# Patient Record
Sex: Female | Born: 1935 | Race: White | Hispanic: No | Smoking: Never smoker
Health system: Southern US, Community
[De-identification: ages and names within clinical notes are randomized; demographics above are authoritative.]

## PROBLEM LIST (undated history)

## (undated) DIAGNOSIS — I10 Essential (primary) hypertension: Secondary | ICD-10-CM

## (undated) HISTORY — PX: SHOULDER SURGERY: SHX246

---

## 1998-01-05 ENCOUNTER — Inpatient Hospital Stay (HOSPITAL_COMMUNITY): Admission: EM | Admit: 1998-01-05 | Discharge: 1998-01-09 | Payer: Self-pay | Admitting: Emergency Medicine

## 1999-09-01 ENCOUNTER — Encounter: Payer: Self-pay | Admitting: Internal Medicine

## 1999-09-01 ENCOUNTER — Encounter: Admission: RE | Admit: 1999-09-01 | Discharge: 1999-09-01 | Payer: Self-pay | Admitting: Internal Medicine

## 2001-10-27 ENCOUNTER — Encounter: Admission: RE | Admit: 2001-10-27 | Discharge: 2001-10-27 | Payer: Self-pay | Admitting: Internal Medicine

## 2001-10-27 ENCOUNTER — Encounter: Payer: Self-pay | Admitting: Internal Medicine

## 2008-03-18 ENCOUNTER — Ambulatory Visit: Payer: Self-pay | Admitting: Occupational Medicine

## 2008-03-18 DIAGNOSIS — I10 Essential (primary) hypertension: Secondary | ICD-10-CM | POA: Insufficient documentation

## 2009-06-27 ENCOUNTER — Ambulatory Visit: Payer: Self-pay | Admitting: Family Medicine

## 2009-06-27 DIAGNOSIS — L509 Urticaria, unspecified: Secondary | ICD-10-CM | POA: Insufficient documentation

## 2009-06-27 DIAGNOSIS — F411 Generalized anxiety disorder: Secondary | ICD-10-CM | POA: Insufficient documentation

## 2010-06-18 NOTE — Assessment & Plan Note (Signed)
Summary: Rash - Upper extremities x 2 weeks rm 2   Vital Signs:  Patient Profile:   77 Years Old Female CC:      Rash - x 2 weeks all over body Height:     65 inches Weight:      136 pounds O2 Sat:      100 % O2 treatment:    Room Air Temp:     97.4 degrees F oral Pulse rate:   68 / minute Pulse rhythm:   regular Resp:     16 per minute BP sitting:   188 / 81  (right arm) Cuff size:   regular  Vitals Entered By: Areta Haber CMA (June 27, 2009 7:44 PM)                  Current Allergies: No known allergies History of Present Illness Chief Complaint: Rash - x 2 weeks all over body History of Present Illness: Subjective:  Patient complains of 2 week history of intermittent pruritic rasn on trunk and arms.  Lesions are raised and appear and disappear, improved with Benadryl and desoximetasone 0.05% cream.  She feels well otherwise.  Her daughter states that her mother has been been worrying excessively about family issues.  She states that she had a normal exam and blood work in December by her PCP.  No recent fevers, chills, and sweats.  No resp, GI, or GU symptoms  No past history of asthma, eczema, seasonal allergies.  Current Problems: ANXIETY (ICD-300.00) URTICARIA (ICD-708.9) HYPERTENSION (ICD-401.9)   Current Meds BENICAR HCT 40-12.5 MG TABS (OLMESARTAN MEDOXOMIL-HCTZ) 1 tab by mouth qd HYDROXYZINE HCL 10 MG TABS (HYDROXYZINE HCL) 1 or 2 by mouth q4-6hr as needed itching and/or anxiety PREDNISONE 10 MG TABS (PREDNISONE) 2 PO BID for 2 days, then 1 BID for 2 days, then 1 daily for 2 days.  Take PC  REVIEW OF SYSTEMS       Skin       Comments: Upper extremities x 2 weeks Other Comments: Pt has not seen PCP for this.   Past History:  Past Medical History: Last updated: 03/18/2008 Hypertension  Past Surgical History: Last updated: 03/18/2008 Complete shoulder replacement - 2007  Family History: Last updated: 03/18/2008 Family History of  Cardiovascular disorder  Social History: Last updated: 03/18/2008 Widow/Widower Never Smoked Alcohol use-no Drug use-no Regular exercise-yes  Risk Factors: Exercise: yes (03/18/2008)  Risk Factors: Smoking Status: never (03/18/2008)   Objective:  Appearance:  Patient appears healthy, younger than stated age, and in no acute distress  Eyes:  Pupils are equal, round, and reactive to light and accomdation.  Extraocular movement is intact.  Conjunctivae are not inflamed.  Ears:  Canals normal.  Tympanic membranes normal.   Nose:   No sinus tenderness Mouth:  No lesions Pharynx:  Normal Neck:  Supple.  No adenopathy is present.   Lungs:  Clear to auscultation.  Breath sounds are equal.  Heart:  Regular rate and rhythm without murmurs, rubs, or gallops.  Abdomen:  Nontender without masses or hepatosplenomegaly.  Bowel sounds are present.  No CVA or flank tenderness.  Skin:  scattered urticarial lesions on trunk and arms. Assessment New Problems: ANXIETY (ICD-300.00) URTICARIA (ICD-708.9)  Suspect urticaria secondary to underlying anxiety.  Plan New Medications/Changes: PREDNISONE 10 MG TABS (PREDNISONE) 2 PO BID for 2 days, then 1 BID for 2 days, then 1 daily for 2 days.  Take PC  #14 x 0, 06/27/2009, Donna Christen MD HYDROXYZINE HCL  10 MG TABS (HYDROXYZINE HCL) 1 or 2 by mouth q4-6hr as needed itching and/or anxiety  #30 x 1, 06/27/2009, Donna Christen MD  New Orders: Est. Patient Level IV [04540] Planning Comments:   Tapering course of prednisone.  Vistaril as needed for anxiety and itching. Follow-up with PCP   The patient and/or caregiver has been counseled thoroughly with regard to medications prescribed including dosage, schedule, interactions, rationale for use, and possible side effects and they verbalize understanding.  Diagnoses and expected course of recovery discussed and will return if not improved as expected or if the condition worsens. Patient and/or caregiver  verbalized understanding.  Prescriptions: PREDNISONE 10 MG TABS (PREDNISONE) 2 PO BID for 2 days, then 1 BID for 2 days, then 1 daily for 2 days.  Take PC  #14 x 0   Entered and Authorized by:   Donna Christen MD   Signed by:   Donna Christen MD on 06/27/2009   Method used:   Print then Give to Patient   RxID:   [id number] HYDROXYZINE HCL 10 MG TABS (HYDROXYZINE HCL) 1 or 2 by mouth q4-6hr as needed itching and/or anxiety  #30 x 1   Entered and Authorized by:   Donna Christen MD   Signed by:   Donna Christen MD on 06/27/2009   Method used:   Print then Give to Patient   RxID:   [id number]

## 2013-06-05 ENCOUNTER — Emergency Department
Admission: EM | Admit: 2013-06-05 | Discharge: 2013-06-05 | Disposition: A | Discharge status: Home / Self Care | Payer: Medicare Other | Source: Home / Self Care | Attending: Emergency Medicine | Admitting: Emergency Medicine

## 2013-06-05 ENCOUNTER — Encounter: Payer: Self-pay | Admitting: Emergency Medicine

## 2013-06-05 DIAGNOSIS — L508 Other urticaria: Secondary | ICD-10-CM

## 2013-06-05 HISTORY — DX: Essential (primary) hypertension: I10

## 2013-06-05 MED ORDER — PREDNISONE 20 MG PO TABS: 20.0000 mg | ORAL_TABLET | Freq: Two times a day (BID) | ORAL | Status: DC

## 2013-06-05 MED ORDER — PREDNISONE 20 MG PO TABS: 20.0000 mg | ORAL_TABLET | Freq: Two times a day (BID) | ORAL | Status: AC

## 2013-06-05 MED ORDER — METHYLPREDNISOLONE ACETATE 80 MG/ML IJ SUSP
80.0000 mg | Freq: Once | INTRAMUSCULAR | Status: AC
  Administered 2013-06-05: 80 mg via INTRAMUSCULAR

## 2013-06-05 MED ORDER — CETIRIZINE HCL 10 MG PO TABS: ORAL_TABLET | ORAL | Status: AC

## 2013-06-05 MED ORDER — TRIAMCINOLONE ACETONIDE 0.1 % EX CREA: 1.0000 "application " | TOPICAL_CREAM | Freq: Two times a day (BID) | CUTANEOUS | Status: AC

## 2013-06-05 NOTE — ED Provider Notes (Signed)
CSN: [id number]     Arrival date & time 06/05/13  0845 History   First MD Initiated Contact with Patient 06/05/13 0915     Chief Complaint  Patient presents with  . Rash    Patient is a 80 y.o. female presenting with rash. The history is provided by the patient.  Rash Pain location:  Generalized Pain quality comment:  No pain. Rash is severely pruritic Pain severity:  No pain Onset quality:  Gradual Duration:  4 days Progression:  Worsening Chronicity:  New Context: not diet changes, not recent illness, not recent travel, not sick contacts and not suspicious food intake   Relieved by: Benadryl at nighttime helps a little. Associated symptoms: no chest pain, no chills, no cough, no diarrhea, no fatigue, no fever, no nausea, no sore throat and no vomiting    Sandra Dalton c/o itchy red raised rash x 4 days. She reports the rash itches and has moved over the last couple days. It was on her arms but is now under breasts, around sides and on pelvis. No new lotions, detergents, medications, etc. Used Benadryl cream otc., Last night, which helped somewhat.  No new medications. She does not recall any insect bites. No other complaints. No chest pain or shortness of breath or breathing problem.  No problem swallowing   Past Medical History  Diagnosis Date  . Hypertension    Past Surgical History  Procedure Laterality Date  . Shoulder surgery     History reviewed. No pertinent family history. History  Substance Use Topics  . Smoking status: Never Smoker   . Smokeless tobacco: Never Used  . Alcohol Use: No   OB History   Grav Para Term Preterm Abortions TAB SAB Ect Mult Living                 Review of Systems  Constitutional: Negative for fever, chills and fatigue.  HENT: Negative for sore throat.   Respiratory: Negative for cough.   Cardiovascular: Negative for chest pain.  Gastrointestinal: Negative for nausea, vomiting and diarrhea.  Skin: Positive for rash.  All other systems  reviewed and are negative.    Allergies  Review of patient's allergies indicates no known allergies.  Home Medications   Current Outpatient Rx  Name  Route  Sig  Dispense  Refill  . AMLODIPINE BESYLATE PO   Oral   Take by mouth.         . telmisartan-hydrochlorothiazide (MICARDIS HCT) 80-12.5 MG per tablet   Oral   Take 1 tablet by mouth daily.         . cetirizine (ZYRTEC) 10 MG tablet      Take 1 daily as needed for itch   15 tablet   0   . predniSONE (DELTASONE) 20 MG tablet   Oral   Take 1 tablet (20 mg total) by mouth 2 (two) times daily with a meal. X 5 days   10 tablet   0   . triamcinolone cream (KENALOG) 0.1 %   Topical   Apply 1 application topically 2 (two) times daily.   45 g   0    BP 117/77  Pulse 94  Temp(Src) 98.3 F (36.8 C) (Oral)  Resp 94  Wt 128 lb (58.06 kg)  SpO2 98% Physical Exam  Nursing note and vitals reviewed. Constitutional: She is oriented to person, place, and time. She appears well-developed and well-nourished. No distress.  HENT:  Head: Normocephalic and atraumatic.  Mouth/Throat: Uvula is midline,  oropharynx is clear and moist and mucous membranes are normal. No oropharyngeal exudate, posterior oropharyngeal edema or posterior oropharyngeal erythema.  Eyes: Conjunctivae and EOM are normal. Pupils are equal, round, and reactive to light. No scleral icterus.  Neck: Normal range of motion.  Cardiovascular: Normal rate, regular rhythm and normal heart sounds.   Pulmonary/Chest: Effort normal and breath sounds normal.  Abdominal: She exhibits no distension.  Musculoskeletal: Normal range of motion.  Neurological: She is alert and oriented to person, place, and time.  Skin: Skin is warm. Rash noted. Rash is urticarial. She is not diaphoretic.  Patchy, blanching, urticarial rash on upper extremities, bilateral upper abdomen, trunk, and few scattered lower extremities.  No vesicles or pustules or papules.  Psychiatric: She has  a normal mood and affect.   Rash has some scattered areas on neck, but face not involved. No lip swelling or wheezing. ED Course  Procedures (including critical care time) Labs Review Labs Reviewed - No data to display Imaging Review No results found.  EKG Interpretation    Date/Time:    Ventricular Rate:    PR Interval:    QRS Duration:   QT Interval:    QTC Calculation:   R Axis:     Text Interpretation:              MDM   1. Urticaria, acute    Treatment options discussed, as well as risks, benefits, alternatives. Patient voiced understanding and agreement with the following plans: Depo-Medrol 80 mg IM stat Prednisone 20 mg by mouth twice a day x5 days Zyrtec, 10 mg in a.m. May continue Benadryl, one at bedtime.--Precautions discussed Triamcinolone cream, apply twice a day as needed Other symptomatic care discussed. Written instructions given  An After Visit Summary was printed and given to the patient. Followup with Dr. Kirby Funk, her PCP within one week.--If any severe or worsening symptoms, go to ER stat. Precautions discussed. Red flags discussed. Questions invited and answered. Patient voiced understanding and agreement.     Lajean Manes, MD 06/05/13 [phone]

## 2013-06-05 NOTE — ED Notes (Signed)
Charleene c/o red raised rash x 4 days. She reports the rash itches and has moved over the last couple days. It was on her arms but is not under breasts, around sides and on pelvis. No new lotions, detergents, medications, etc. Used Benadryl cream otc.

## 2016-07-27 ENCOUNTER — Emergency Department (HOSPITAL_COMMUNITY): Payer: Medicare Other

## 2016-07-27 ENCOUNTER — Inpatient Hospital Stay (HOSPITAL_COMMUNITY)
Admission: EM | Admit: 2016-07-27 | Discharge: 2016-07-29 | DRG: 065 | Disposition: A | Discharge status: Hospice | Payer: Medicare Other | Attending: Family Medicine | Admitting: Family Medicine

## 2016-07-27 DIAGNOSIS — R29722 NIHSS score 22: Secondary | ICD-10-CM | POA: Diagnosis present

## 2016-07-27 DIAGNOSIS — Z66 Do not resuscitate: Secondary | ICD-10-CM | POA: Diagnosis present

## 2016-07-27 DIAGNOSIS — Z515 Encounter for palliative care: Secondary | ICD-10-CM | POA: Diagnosis present

## 2016-07-27 DIAGNOSIS — R4701 Aphasia: Secondary | ICD-10-CM | POA: Diagnosis not present

## 2016-07-27 DIAGNOSIS — G8191 Hemiplegia, unspecified affecting right dominant side: Secondary | ICD-10-CM | POA: Diagnosis present

## 2016-07-27 DIAGNOSIS — I63422 Cerebral infarction due to embolism of left anterior cerebral artery: Secondary | ICD-10-CM | POA: Diagnosis present

## 2016-07-27 DIAGNOSIS — I63412 Cerebral infarction due to embolism of left middle cerebral artery: Secondary | ICD-10-CM | POA: Diagnosis not present

## 2016-07-27 DIAGNOSIS — I63512 Cerebral infarction due to unspecified occlusion or stenosis of left middle cerebral artery: Secondary | ICD-10-CM | POA: Diagnosis not present

## 2016-07-27 DIAGNOSIS — W19XXXA Unspecified fall, initial encounter: Secondary | ICD-10-CM | POA: Diagnosis present

## 2016-07-27 DIAGNOSIS — R29810 Facial weakness: Secondary | ICD-10-CM | POA: Diagnosis present

## 2016-07-27 DIAGNOSIS — Z86718 Personal history of other venous thrombosis and embolism: Secondary | ICD-10-CM

## 2016-07-27 DIAGNOSIS — Z823 Family history of stroke: Secondary | ICD-10-CM

## 2016-07-27 DIAGNOSIS — I459 Conduction disorder, unspecified: Secondary | ICD-10-CM | POA: Diagnosis present

## 2016-07-27 DIAGNOSIS — Y92002 Bathroom of unspecified non-institutional (private) residence single-family (private) house as the place of occurrence of the external cause: Secondary | ICD-10-CM

## 2016-07-27 DIAGNOSIS — I10 Essential (primary) hypertension: Secondary | ICD-10-CM | POA: Diagnosis present

## 2016-07-27 LAB — I-STAT CHEM 8, ED
BUN: 32 mg/dL — ABNORMAL HIGH (ref 6–20)
CALCIUM ION: 1.21 mmol/L (ref 1.15–1.40)
CHLORIDE: 103 mmol/L (ref 101–111)
Creatinine, Ser: 1.2 mg/dL — ABNORMAL HIGH (ref 0.44–1.00)
Glucose, Bld: 182 mg/dL — ABNORMAL HIGH (ref 65–99)
HEMATOCRIT: 39 % (ref 36.0–46.0)
Hemoglobin: 13.3 g/dL (ref 12.0–15.0)
Potassium: 4.1 mmol/L (ref 3.5–5.1)
SODIUM: 138 mmol/L (ref 135–145)
TCO2: 24 mmol/L (ref 0–100)

## 2016-07-27 LAB — CBC
HEMATOCRIT: 39.8 % (ref 36.0–46.0)
HEMOGLOBIN: 13 g/dL (ref 12.0–15.0)
MCH: 31.9 pg (ref 26.0–34.0)
MCHC: 32.7 g/dL (ref 30.0–36.0)
MCV: 97.8 fL (ref 78.0–100.0)
Platelets: 169 10*3/uL (ref 150–400)
RBC: 4.07 MIL/uL (ref 3.87–5.11)
RDW: 13.2 % (ref 11.5–15.5)
WBC: 11.9 10*3/uL — ABNORMAL HIGH (ref 4.0–10.5)

## 2016-07-27 LAB — COMPREHENSIVE METABOLIC PANEL
ALBUMIN: 4.4 g/dL (ref 3.5–5.0)
ALT: 14 U/L (ref 14–54)
ANION GAP: 12 (ref 5–15)
AST: 25 U/L (ref 15–41)
Alkaline Phosphatase: 65 U/L (ref 38–126)
BUN: 29 mg/dL — ABNORMAL HIGH (ref 6–20)
CO2: 25 mmol/L (ref 22–32)
Calcium: 10.4 mg/dL — ABNORMAL HIGH (ref 8.9–10.3)
Chloride: 99 mmol/L — ABNORMAL LOW (ref 101–111)
Creatinine, Ser: 1.21 mg/dL — ABNORMAL HIGH (ref 0.44–1.00)
GFR calc Af Amer: 44 mL/min — ABNORMAL LOW (ref >60)
GFR calc non Af Amer: 38 mL/min — ABNORMAL LOW (ref >60)
GLUCOSE: 180 mg/dL — AB (ref 65–99)
POTASSIUM: 4.3 mmol/L (ref 3.5–5.1)
Sodium: 136 mmol/L (ref 135–145)
Total Bilirubin: 0.8 mg/dL (ref 0.3–1.2)
Total Protein: 6.9 g/dL (ref 6.5–8.1)

## 2016-07-27 LAB — ETHANOL: Alcohol, Ethyl (B): <5 mg/dL (ref <5)

## 2016-07-27 LAB — CK TOTAL AND CKMB (NOT AT ARMC)
CK TOTAL: 106 U/L (ref 38–234)
CK, MB: 3.7 ng/mL (ref 0.5–5.0)
RELATIVE INDEX: 3.5 — AB (ref 0.0–2.5)

## 2016-07-27 LAB — I-STAT CG4 LACTIC ACID, ED: Lactic Acid, Venous: 1.94 mmol/L (ref 0.5–1.9)

## 2016-07-27 LAB — PROTIME-INR
INR: 0.96
Prothrombin Time: 12.8 seconds (ref 11.4–15.2)

## 2016-07-27 MED ORDER — IOPAMIDOL (ISOVUE-370) INJECTION 76%
INTRAVENOUS | Status: AC
  Administered 2016-07-27: 100 mL
  Filled 2016-07-27: qty 100

## 2016-07-27 NOTE — ED Notes (Signed)
This RN going to CT with patient. Placed on NRB mask in CT d/t SpO2 dropping to 85% RA. Neurologist at bedside.

## 2016-07-27 NOTE — ED Notes (Signed)
Shanda BumpsJessica, RN accompanying patient to CT 2 at this time.

## 2016-07-27 NOTE — ED Notes (Signed)
Dr. Rhunette CroftNanavati called Code Stroke at 2326.

## 2016-07-27 NOTE — Progress Notes (Signed)
Orthopedic Tech Progress Note Patient Details:  Sandra Dalton 04/12/1925 [id number] Level 2 trauma ortho visit. Patient ID: Sandra Dalton, female   DOB: 03/12/1925, 83 y.o.   MRN: [id number]   Jennye MoccasinHughes, Milany Geck Craig 07/27/2016, 10:55 PM

## 2016-07-27 NOTE — Consult Note (Signed)
Neurology Consultation Reason for Consult: Aphasia Referring Physician: Rhunette Croft, A  CC: Left sided weakness  History is obtained from: Family  HPI: Sandra Dalton is a 83 y.o. female who is completely independent, lives alone, without memory complaints per family. She spoke with a family member around 6:15 pm and subsequently was found shortly after 9pm with blood on her, down in the bathroom.   She was brought in as a trauma due to the blood and being found down, but after arrival, it was noticed that she had right sided plegia and aphasia.   LKW: 6:15pm tpa given?: no, out of window Premorbid modified rankin scale: 0    ROS: A 14 point ROS was performed and is negative except as noted in the HPI.   PMHx: HTN  Meds:  Micardis-HCT  FHx: Unable to assess secondary to patient's altered mental status.    Social History:  has no tobacco, alcohol, and drug history on file. Unable to assess secondary to patient's altered mental status.     Exam: Current vital signs: BP 163/88   Pulse 96   Temp 97.3 F (36.3 C) (Oral)   Resp 21   SpO2 93%  Vital signs in last 24 hours: Temp:  [97.3 F (36.3 C)] 97.3 F (36.3 C) (03/13 2255) Pulse Rate:  [93-101] 96 (03/13 2345) Resp:  [20-26] 21 (03/13 2345) BP: (163-193)/(78-107) 163/88 (03/13 2345) SpO2:  [93 %-100 %] 93 % (03/13 2345)   Physical Exam  Constitutional: Appears elderly Psych: Affect appropriate to situation Eyes: No scleral injection HENT: No OP obstrucion Head: Normocephalic.  Cardiovascular: Normal rate and regular rhythm.  Respiratory: Effort normal and breath sounds normal to anterior ascultation GI: Soft.  No distension. There is no tenderness.  Skin: WDI  Neuro: Mental Status: Patient is drowsy, but makes purposeful movements with her left hand. She does not follow commands or have any verbal output Cranial Nerves: II: She has a right hemianopia. Pupils are equal, round, and reactive to  light. III,IV, VI: She has a left gaze preference V:VII: She has a right facial droop VIII: hearing is impaired, but she does respond to loud noise X: She does not cooperate XII: She does not stick out her tongue Motor: She has a right hemiplegia, with some flexion to noxious stimuli in the right arm and triple flexion in the right leg. She moves left side purposefully with good strength Sensory: She does not respond to noxious stimuli as much on the right as the left Cerebellar: She does not perform   I have reviewed labs in epic and the results pertinent to this consultation are: Chem 8-unremarkable other than mildly elevated creatinine  I have reviewed the images obtained: CT perfusion-151 mL of infarct with much larger area of penumbra. CTA there does seem to be some filling of the posterior division of the MCA through collateralization  Impression: 83 year old female with left ICA-MCA occlusion. She appears to have already infarcted the majority of the left MCA territory. I don't think that any type of intervention at this point would be at all likely to restore any type of independence and given the size of the core infarct, she would not be a candidate for intra-arterial intervention.  I discussed that she would likely have severe right-sided weakness and aphasia in a best case scenario. The family indicated that in this setting, she would not want any type of artificial life-prolonging measures but would only want things that would provide comfort. I think  that in a 83 year old, this is a very reasonable decision, but did indicate that predicting end-of-life is very difficult and that given that she does have space to swell, I was not sure that the stroke itself would be terminal, though it certainly could be.   Recommendations: 1) comfort care only as per family wishes 2) palliative care consultation   Ritta SlotMcNeill Evett Kassa, MD [facility] [phone]  If 7pm-  7am, please page neurology on call as listed in AMION.

## 2016-07-27 NOTE — ED Triage Notes (Addendum)
Per [facility]: Pt to ED from home following unwitnessed fall in the bathroom. Per EMS, family last saw patient normal at 1745 today and then found her on the floor, supine, pool of blood around her head, lac to posterior scalp. Pt not on blood thinners. GCS 7 with EMS, following commands, + gag. L sided visual gaze, pupils equal and reactive. 18g. EJ and 20g LFA. CBG 198, systolic 190 palp. C-collar in place PTA.

## 2016-07-27 NOTE — ED Notes (Signed)
[facility] called to page Code Stroke, Paged by Elmarie Shileyiffany

## 2016-07-27 NOTE — ED Triage Notes (Signed)
According to EMS, patient normally A&O x 4.

## 2016-07-28 ENCOUNTER — Encounter (HOSPITAL_COMMUNITY): Payer: Self-pay | Admitting: Family Medicine

## 2016-07-28 DIAGNOSIS — I63512 Cerebral infarction due to unspecified occlusion or stenosis of left middle cerebral artery: Secondary | ICD-10-CM | POA: Diagnosis not present

## 2016-07-28 DIAGNOSIS — R29722 NIHSS score 22: Secondary | ICD-10-CM | POA: Diagnosis present

## 2016-07-28 DIAGNOSIS — Z823 Family history of stroke: Secondary | ICD-10-CM | POA: Diagnosis not present

## 2016-07-28 DIAGNOSIS — R4701 Aphasia: Secondary | ICD-10-CM | POA: Diagnosis present

## 2016-07-28 DIAGNOSIS — Z66 Do not resuscitate: Secondary | ICD-10-CM | POA: Diagnosis present

## 2016-07-28 DIAGNOSIS — Z86718 Personal history of other venous thrombosis and embolism: Secondary | ICD-10-CM | POA: Diagnosis not present

## 2016-07-28 DIAGNOSIS — Z515 Encounter for palliative care: Secondary | ICD-10-CM | POA: Diagnosis not present

## 2016-07-28 DIAGNOSIS — W19XXXA Unspecified fall, initial encounter: Secondary | ICD-10-CM | POA: Diagnosis present

## 2016-07-28 DIAGNOSIS — I63412 Cerebral infarction due to embolism of left middle cerebral artery: Secondary | ICD-10-CM | POA: Diagnosis present

## 2016-07-28 DIAGNOSIS — I10 Essential (primary) hypertension: Secondary | ICD-10-CM

## 2016-07-28 DIAGNOSIS — I459 Conduction disorder, unspecified: Secondary | ICD-10-CM | POA: Diagnosis present

## 2016-07-28 DIAGNOSIS — I63422 Cerebral infarction due to embolism of left anterior cerebral artery: Secondary | ICD-10-CM | POA: Diagnosis present

## 2016-07-28 DIAGNOSIS — R29810 Facial weakness: Secondary | ICD-10-CM | POA: Diagnosis present

## 2016-07-28 DIAGNOSIS — Y92002 Bathroom of unspecified non-institutional (private) residence single-family (private) house as the place of occurrence of the external cause: Secondary | ICD-10-CM | POA: Diagnosis not present

## 2016-07-28 DIAGNOSIS — G8191 Hemiplegia, unspecified affecting right dominant side: Secondary | ICD-10-CM | POA: Diagnosis present

## 2016-07-28 LAB — TYPE AND SCREEN
ABO/RH(D): A POS
Antibody Screen: NEGATIVE
Unit division: 0
Unit division: 0

## 2016-07-28 LAB — BPAM RBC
BLOOD PRODUCT EXPIRATION DATE: 201803162359
BLOOD PRODUCT EXPIRATION DATE: 201803172359
ISSUE DATE / TIME: 201803132220
ISSUE DATE / TIME: 201803132220
UNIT TYPE AND RH: 9500
Unit Type and Rh: 9500

## 2016-07-28 LAB — PREPARE FRESH FROZEN PLASMA
UNIT DIVISION: 0
Unit division: 0

## 2016-07-28 LAB — CBG MONITORING, ED: Glucose-Capillary: 177 mg/dL — ABNORMAL HIGH (ref 65–99)

## 2016-07-28 LAB — BPAM FFP
Blood Product Expiration Date: 201803182359
Blood Product Expiration Date: 201803182359
ISSUE DATE / TIME: 201803132221
ISSUE DATE / TIME: 201803132221
Unit Type and Rh: 6200
Unit Type and Rh: 6200

## 2016-07-28 LAB — ABO/RH: ABO/RH(D): A POS

## 2016-07-28 LAB — CDS SEROLOGY

## 2016-07-28 LAB — BLOOD PRODUCT ORDER (VERBAL) VERIFICATION

## 2016-07-28 MED ORDER — WHITE PETROLATUM GEL
Status: AC
  Administered 2016-07-28: 17:00:00
  Filled 2016-07-28: qty 1

## 2016-07-28 MED ORDER — LORAZEPAM 2 MG/ML IJ SOLN
0.5000 mg | Freq: Four times a day (QID) | INTRAMUSCULAR | Status: DC | PRN
Start: 2016-07-28 — End: 2016-07-29

## 2016-07-28 MED ORDER — ONDANSETRON HCL 4 MG/2ML IJ SOLN
4.0000 mg | Freq: Four times a day (QID) | INTRAMUSCULAR | Status: DC | PRN
  Administered 2016-07-28: 4 mg via INTRAVENOUS
  Filled 2016-07-28: qty 2

## 2016-07-28 MED ORDER — ACETAMINOPHEN 325 MG PO TABS: 650.0000 mg | ORAL_TABLET | Freq: Four times a day (QID) | ORAL | Status: DC | PRN

## 2016-07-28 MED ORDER — ACETAMINOPHEN 650 MG RE SUPP: 650.0000 mg | Freq: Four times a day (QID) | RECTAL | Status: DC | PRN

## 2016-07-28 MED ORDER — GLYCOPYRROLATE 0.2 MG/ML IJ SOLN
0.4000 mg | Freq: Four times a day (QID) | INTRAMUSCULAR | Status: DC | PRN
  Administered 2016-07-29: 0.4 mg via INTRAVENOUS
  Filled 2016-07-28: qty 2

## 2016-07-28 MED ORDER — ONDANSETRON HCL 4 MG/2ML IJ SOLN
INTRAMUSCULAR | Status: AC
  Administered 2016-07-28: 4 mg
  Filled 2016-07-28: qty 2

## 2016-07-28 MED ORDER — MORPHINE SULFATE (PF) 4 MG/ML IV SOLN
1.0000 mg | INTRAVENOUS | Status: DC | PRN
  Administered 2016-07-29: 1 mg via INTRAVENOUS
  Filled 2016-07-28: qty 1

## 2016-07-28 MED ORDER — MORPHINE SULFATE (PF) 2 MG/ML IV SOLN
2.0000 mg | INTRAVENOUS | Status: DC
  Administered 2016-07-28 – 2016-07-29 (×4): 2 mg via INTRAVENOUS
  Filled 2016-07-28 (×4): qty 1

## 2016-07-28 MED ORDER — MORPHINE SULFATE (PF) 4 MG/ML IV SOLN
1.0000 mg | INTRAVENOUS | Status: DC | PRN
  Administered 2016-07-28 (×4): 1 mg via INTRAVENOUS
  Filled 2016-07-28 (×3): qty 1

## 2016-07-28 MED ORDER — ORAL CARE MOUTH RINSE
15.0000 mL | Freq: Two times a day (BID) | OROMUCOSAL | Status: DC
  Administered 2016-07-28 – 2016-07-29 (×3): 15 mL via OROMUCOSAL

## 2016-07-28 MED ORDER — HALOPERIDOL LACTATE 5 MG/ML IJ SOLN: 1.0000 mg | Freq: Four times a day (QID) | INTRAMUSCULAR | Status: DC | PRN

## 2016-07-28 MED ORDER — MORPHINE SULFATE (PF) 4 MG/ML IV SOLN
INTRAVENOUS | Status: AC
  Administered 2016-07-28: 1 mg via INTRAVENOUS
  Filled 2016-07-28: qty 1

## 2016-07-28 NOTE — Progress Notes (Signed)
Patient seen and admitted earlier this a.m. by my associate. Please refer to H&P for details. Palliative care consulted and on board.  We'll plan on reassessing next a.m.  Gen: Pt in nad CV: no cyanosis Pulm: equal chest rise  Sandra Dalton, Pamala HurryLANDO

## 2016-07-28 NOTE — Progress Notes (Signed)
Responded to page last night when pt came into ED. Spent time accompanying many family members (4 children, +,some of their spouses and children) through difficult emotional responses to come to realization (as they received several succeeding reports from doctors) that pt would not in her present condition want anything but palliative care.The decision was painful, but made easier b/c they all were sure this is what pt would want. She has a living will at home, and had told them all she would never want to be in a nursing home. The son and wife from [place] arrived partway through these discussions, and the son in [place] last night, who will arrive today, was part of this family decision via phone.  Provided emotional/spiritual/grief support, presence and prayer. Toni AmendCourtney, the most involved grandchild, is a Engineer, civil (consulting)nurse and ultimately led the family in consensus that pt needed to retain her dignity as she had always been very independent. Family showed me photos of pt's wonderful 89th bday party last wkend.  The pastor of the son who has power of attorney, Courtney's dad, came, prayed with family too, and stayed a while. He leads a [employer] [place],.The oldest daughter told this brother she was so happy at this time that he and his family had recently become Christians. Toni AmendCourtney confided to me that pt was the reasson she'd made this decision. The oldest daughter's pastor called in and also prayed with family then in consultation rm via speaker phone.   Checked on pt this morning. Several family members were in rm, peaceful and waiting. Chaplain available for f/u.   07/28/16 0700  Clinical Encounter Type  Visited With Family;Health care provider  Visit Type Initial;Follow-up;Psychological support;Spiritual support;Social support;ED;Other (Comment)  Referral From Nurse (palliative)  Spiritual Encounters  Spiritual Needs Prayer;Emotional;Grief support  Stress Factors  Patient Stress Factors Health  changes;Loss of control  Family Stress Factors Family relationships;Health changes;Loss of control   Sandra Dalton, [address]

## 2016-07-28 NOTE — Progress Notes (Signed)
Palliative Medicine RN Note: Consult order noted. Discussed with Dr Hilma Favors Met with 2 sons, daughter, daughter in law. Pt will open eyes to voice. During my visit she makes no movement with right side; falls to left side. Legs crossed at ankles; she fights uncrossing them; unable to palpate pedal pulses. Pale; [place] in place. Dry, clean dressing to right neck. She has dried blood in patches on her face and under her nails on left hand; cleaned face but she fought me cleaning her hand. Tele monitor in place.  Spoke at length with family. They are very focused on what kind of stroke the pt had and why her blood thinners were stopped after 3 months (she has hx DVT); discussed risks r/t falls & blood thinners in light of patient's vertigo and that physicians follow protocols for making those decisions; family verbalized understanding. They state very clearly that she would not want to be kept alive artificially and that they feel she has no quality of life like this. Discussed natural course if patients cannot eat & where they would like patient to be at the end of life. Family is very familiar with home hospice but unfamiliar with hospice facility.   Pt began to hiccup/retch and family became upset; turned pt to her side and RN gave Zofran. Pt did not vomit.  Continued to explore goals with family. Discussed use of tele and that it does not change outcome or treatment; they are not comfortable de-escalating even that intervention. Family is concerned that RNs will not take BP if the tele is d/c. They are absolutely not ready to make decisions about location for care either. Plan for f/u by PMT member this afternoon at 3:30.   Marjie Skiff Erline Siddoway, RN, BSN, [facility] 07/28/2016 10:10 AM Cell [phone] 8:00-4:00 Monday-Friday Office [phone]

## 2016-07-28 NOTE — Care Management Note (Signed)
Case Management Note  Patient Details  Name: Dorrene Germanvelyn S Dufford MRN: [id number] Date of Birth: 05/02/1925  Subjective/Objective:                  Patient was admitted with CVA. Prior to admission, patient lived at home alone. Per physician notation, patient's prognosis is poor. Palliative medicine following. CM will follow for needs pending patient's progress.   Action/Plan:   Expected Discharge Date:                  Expected Discharge Plan:     In-House Referral:     Discharge planning Services     Post Acute Care Choice:    Choice offered to:     DME Arranged:    DME Agency:     HH Arranged:    HH Agency:     Status of Service:     If discussed at [employer] Length of Stay Meetings, dates discussed:    Additional Comments:  Anda KraftRobarge, Luci Bellucci C, RN 07/28/2016, 11:43 AM

## 2016-07-28 NOTE — Progress Notes (Signed)
STROKE TEAM PROGRESS NOTE   HISTORY OF PRESENT ILLNESS (per record) [name] is a 83 y.o. female who is completely independent, lives alone, without memory complaints per family. She spoke with a family member around 6:15 pm and subsequently was found shortly after 9pm with blood on her, down in the bathroom (LKW 07/27/2016 at 1815).  She was brought in as a trauma due to the blood and being found down, but after arrival, it was noticed that she had right sided plegia and aphasia. Premorbid modified rankin scale: 0. Patient was not administered IV t-PA secondary to presenting outside the window. Family opted to make her comfort care in the ED.  She was admitted for comfort care.    SUBJECTIVE (INTERVAL HISTORY) Meet with pt son and granddaughter and other families in room. Reviewed images with them and answered all their questions. They stated that pt would never want to live in a condition without quality of life and not want ventilation or feeding tube. They requested comfort care and I agree.    OBJECTIVE Temp:  [97.3 F (36.3 C)-97.4 F (36.3 C)] 97.4 F (36.3 C) (03/14 0240) Pulse Rate:  [75-101] 86 (03/14 0800) Cardiac Rhythm: Heart block;Normal sinus rhythm (03/14 0700) Resp:  [19-26] 20 (03/14 0240) BP: (129-193)/(68-107) 150/82 (03/14 0800) SpO2:  [93 %-100 %] 100 % (03/14 0800) Weight:  [60.3 kg (132 lb 15 oz)] 60.3 kg (132 lb 15 oz) (03/14 0240)  CBC:  Recent Labs Lab 07/27/16 2251 07/27/16 2255  WBC 11.9*  --   HGB 13.0 13.3  HCT 39.8 39.0  MCV 97.8  --   PLT 169  --     Basic Metabolic Panel:  Recent Labs Lab 07/27/16 2251 07/27/16 2255  NA 136 138  K 4.3 4.1  CL 99* 103  CO2 25  --   GLUCOSE 180* 182*  BUN 29* 32*  CREATININE 1.21* 1.20*  CALCIUM 10.4*  --     IMAGING I have personally reviewed the radiological images below and agree with the radiology interpretations.  Ct Head Wo Contrast 07/27/2016 2326 Head: No acute intracranial  abnormalities. Chronic atrophy and small vessel ischemic changes.   Ct Cervical Spine Wo Contrast 07/27/2016 Diffuse degenerative changes. Nonspecific straightening of usual cervical lordosis. Prominent degenerative changes at C1 to with mild basilar invagination. Bones cyst at the odontoid process of C2. No acute displaced fractures identified in the cervical spine. Atelectasis or consolidation demonstrated in the lung apices.   Ct Angio Head W Or Wo Contrast Result Date: 07/28/2016 Acute LEFT MCA occlusion, collateral vessels filling posterior segments. Developmentally absent RIGHT A1 segment with thready LEFT A1 segment suggesting retrograde thrombosis to the carotid terminus. Moderate intracranial atherosclerosis.   Ct Angio Neck W Or Wo Contrast 07/28/2016 Gradual loss of LEFT internal carotid artery contrast opacification most compatible with slow flow in distal occlusion. No hemodynamically significant stenosis.   Ct Cerebral Perfusion W Contrast 07/28/2016 Acute large LEFT MCA territory completed infarct with penumbra within the posterior margin of the LEFT MCA territory and LEFT anterior cerebral artery territory.    PHYSICAL EXAM  Temp:  [97.2 F (36.2 C)-97.4 F (36.3 C)] 97.2 F (36.2 C) (03/14 0800) Pulse Rate:  [75-101] 86 (03/14 0800) Resp:  [18-26] 18 (03/14 0800) BP: (129-193)/(68-107) 150/82 (03/14 0800) SpO2:  [93 %-100 %] 100 % (03/14 0800) Weight:  [132 lb 15 oz (60.3 kg)] 132 lb 15 oz (60.3 kg) (03/14 0240)  Exam is limited due to comfort  care status.  General - thin built, well developed, poor responsive.  Ophthalmologic - Fundi not visualized.  Cardiovascular - Regular rate and rhythm.  Neuro - poorly responsive, not open eyes on voice. Intermittent moving head. Eyes not open. Did not move extremities during encounter. Other examination was limited due to comfort care status.    ASSESSMENT/PLAN Sandra Dalton is a 83 y.o. female with history of HTN  found down with R hemiparesis, global aphasia and L gaze preference. She did not receive IV t-PA due to delay in arrival.   Stroke:  left MCA/ACA infarct in setting of L MCA occlusion, likely embolic secondary to unknown source  CT head no acute abnormalities  CT spine no acute fx. Diffuse degenerative changes  CTA head L MCA occlusion. Absent R A1 and thready L A1 suggests ICA terminus clot. Moderate intracranial atherosclerosis  CTA neck L ICA slow flow at petrous portion, concerning for thrombus  CT perfusion large L MCA / L ACA territory completed infarct with limited penumbra  Diet NPO time specified  No antithrombotic prior to admission  Palliative care team consulted. Spoke with family at length related to goals of care. Family focused on diagnosis and cause, not ready to decide on disposition. Palliative care team to followup  I discussed with family at bedside at length, all the members of the family have been explained about the diagnosis, current plan and poor prognosis. They are in unanimous decision (as per patient wishes) that they want comfort care given the nature of the condition is serious without meaningful recovery and with no quality of life. I agree that comfort care at this condition is appropriate and reasonable. We also had discussion with comfort care measures. And family is now more prepared for comfort care measures. Our palliative care team will revisit family at 3:30pm and I would expect full comfort care measure after that meeting.   Disposition: expecting comfort care and residence hospice  Hypertension  On diovan/HCT prior to admission  BP flucuation at home recently  Other Stroke Risk Factors  Advanced age  Hx DVT in 11/2015, taken off blood thinners 3 mos after  Had recent chest pain but did not see doctor  Hospital day # 0  Neurology will sign off, but will be available if family or other services have any further questions. Thanks for the  consult.  Marvel PlanJindong [name], MD PhD Stroke Neurology 07/28/2016 2:47 PM   To contact Stroke Continuity provider, please refer to [email or web address]. After hours, contact General Neurology

## 2016-07-28 NOTE — H&P (Signed)
History and Physical  Patient Name: Sandra Dalton     WUJ:[id number]    DOB: February 03, 1925    DOA: 07/27/2016 PCP: Lillia Mountain, MD   Patient coming from: Home via EMS     Chief Complaint: Fall, right sided weakness, aphasia  HPI: Sandra Dalton is a 83 y.o. female with a past medical history significant for HTN who presents with stroke.  All history collected from the patient's family, as the patient is unresponsive.  Patient was in her usual state of health (lives independently, new to family members, independent with all ADLs, no dementia, very active). Family spoke with her by phone on 6:15, at around 10 PM, a family member came by the house to check on her, found her in the bathroom on the floor, collapsed, not moving her right side, unable to speak, with left gaze preference. She was transported to the ER by EMS.  ED course: -Afebrile, heart rate 100, respirations and pulse ox normal, blood pressure 171/100 -Na 136, K 4.3, Cr 1.21, WBC 11.9K, Hgb 13 -CT of the head and C-spine were negative for fracture or hemorrhage or mass -Pelvis radiograph is normal -Chest x-ray was normal -ECG showed sinus tachycardia -Code stroke was called, CT angiogram and perfusion study showed occlusion of the left MCA, large penumbra involving most of the distribution of the left MCA -Neurology discussed the patient's likely prognosis with family, that no therapeutic options were available, that with supportive cares, in the best case she would be aphasic and hemiplegic and dependent; family noted that the patient was previously very active and independent and would have elected for comfort cares only     Review of systems:  Review of Systems  Unable to perform ROS: Patient unresponsive         Past Medical History:  Diagnosis Date  . Hypertension     Past Surgical History:  Procedure Laterality Date  . SHOULDER SURGERY      Social History: Patient lives alone.  Patient walks  unassisted.  She is not a smoker.  She is from [place] originally, lives there now, close to her family.  She is a widow, husband died 27 years ago.    No Known Allergies  Family history: family history includes Stroke in her mother.  Prior to Admission medications   Medication Sig Start Date End Date Taking? Authorizing Provider  telmisartan-hydrochlorothiazide (MICARDIS HCT) 40-12.5 MG tablet Take 1 tablet by mouth daily.   Yes Historical Provider, MD     Physical Exam: BP 145/87   Pulse 93   Temp 97.3 F (36.3 C) (Oral)   Resp 23   SpO2 98%  General appearance: Elderly female, does not open eyes, some spontaneous movements, no spontaneous verbalizations.  Some dried blood in hair and on face. Eyes: Anicteric, conjunctiva pink, lids and lashes normal. PERRL.   Left gaze preference. ENT: No nasal deformity, discharge, epistaxis.   Skin: Warm and dry.  No jaundice.  No suspicious rashes or lesions. Cardiac: RRR, nl S1-S2, no murmurs appreciated.  Capillary refill is brisk.  JVP normal.  No LE edema.  Radial and DP pulses 2+ and symmetric.  No carotid bruits. Respiratory: Normal respiratory rate and rhythm.   GI: Abdomen soft without rigidity.  No grimace with palpation. No ascites, distension, no hepatosplenomegaly.   MSK: No deformities or effusions. Neuro: Pupils are 4 mm and reactive to 3 mm. Left gaze preference.  Does not follow commands.  Right facial droop.  No  withdrawal from pain on right.  Some spontaneous retractions on right.  Left strength grip is present, otherwise does not follow commands.    Psych: Unable to assess.    Labs on Admission:  I have personally reviewed following labs and imaging studies: CBC:  Recent Labs Lab 07/27/16 2251 07/27/16 2255  WBC 11.9*  --   HGB 13.0 13.3  HCT 39.8 39.0  MCV 97.8  --   PLT 169  --    Basic Metabolic Panel:  Recent Labs Lab 07/27/16 2251 07/27/16 2255  NA 136 138  K 4.3 4.1  CL 99* 103  CO2 25  --     GLUCOSE 180* 182*  BUN 29* 32*  CREATININE 1.21* 1.20*  CALCIUM 10.4*  --    GFR: CrCl cannot be calculated (Unknown ideal weight.). Liver Function Tests:  Recent Labs Lab 07/27/16 2251  AST 25  ALT 14  ALKPHOS 65  BILITOT 0.8  PROT 6.9  ALBUMIN 4.4   No results for input(s): LIPASE, AMYLASE in the last 168 hours. No results for input(s): AMMONIA in the last 168 hours. Coagulation Profile:  Recent Labs Lab 07/27/16 2251  INR 0.96   Cardiac Enzymes:  Recent Labs Lab 07/27/16 2251  CKTOTAL 106  CKMB 3.7   BNP (last 3 results) No results for input(s): PROBNP in the last 8760 hours. HbA1C: No results for input(s): HGBA1C in the last 72 hours. CBG:  Recent Labs Lab 07/28/16 0008  GLUCAP 177*   Lipid Profile: No results for input(s): CHOL, HDL, LDLCALC, TRIG, CHOLHDL, LDLDIRECT in the last 72 hours. Thyroid Function Tests: No results for input(s): TSH, T4TOTAL, FREET4, T3FREE, THYROIDAB in the last 72 hours. Anemia Panel: No results for input(s): VITAMINB12, FOLATE, FERRITIN, TIBC, IRON, RETICCTPCT in the last 72 hours. Sepsis Labs: Lactate 1.94 Invalid input(s): PROCALCITONIN, LACTICIDVEN No results found for this or any previous visit (from the past 240 hour(s)).    Radiological Exams on Admission: Personally reviewed CT head and c-spine reports, CT aniogram reports, and radiograph of pelvis report.  CXR personally reviewed and shows no focal opacity or pneumonia: Ct Angio Head W Or Wo Contrast  Result Date: 07/28/2016 CLINICAL DATA:  Aphasia after fall in bathroom. EXAM: CT ANGIOGRAPHY HEAD AND NECK CT PERFUSION BRAIN TECHNIQUE: Multidetector CT imaging of the head and neck was performed using the standard protocol during bolus administration of intravenous contrast. Multiplanar CT image reconstructions and MIPs were obtained to evaluate the vascular anatomy. Carotid stenosis measurements (when applicable) are obtained utilizing NASCET criteria, using the  distal internal carotid diameter as the denominator. Multiphase CT imaging of the brain was performed following IV bolus contrast injection. Subsequent parametric perfusion maps were calculated using RAPID software. CONTRAST:  100 cc Isovue 370 COMPARISON:  CT HEAD July 27, 2016 at 2308 hours FINDINGS: CTA NECK AORTIC ARCH: Normal appearance of the thoracic arch, normal branch pattern. Mild calcific atherosclerosis of arch vessel origins. The origins of the innominate, left Common carotid artery and subclavian artery are widely patent. RIGHT CAROTID SYSTEM: Common carotid artery is widely patent, coursing in a straight line fashion. Normal appearance of the carotid bifurcation without hemodynamically significant stenosis by NASCET criteria. Tortuous internal carotid artery narrowing is patent. LEFT CAROTID SYSTEM: Common carotid artery is widely patent, coursing in a straight line fashion, minimal calcific atherosclerosis. Early carotid bifurcation without hemodynamically significant stenosis by NASCET criteria. Gradual decrease contrast opacification of LEFT cervical internal carotid artery most compatible with distal occlusion. VERTEBRAL ARTERIES:Left vertebral  artery is dominant, mild stenosis of LEFT vertebral artery origin due to atherosclerosis. Bilateral vertebral arteries are patent, and mild extrinsic deformity due to degenerative cervical spine. SKELETON: No acute osseous process though bone windows have not been submitted. Multilevel severe degenerative discs. 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 OTHER NECK: Soft tissues of the neck are non-acute though, not tailored for evaluation. Dependent atelectasis. 2.7 cm heterogeneous RIGHT thyroid nodule. LEFT external jugular intravenous catheter. CTA HEAD ANTERIOR CIRCULATION: Complete loss of contrast opacification LEFT internal carotid artery through the level of the carotid terminus. RIGHT internal carotid artery is patent  though, moderately stenotic at supraclinoid segment with mild poststenotic dilatation. Thready LEFT A1 segment with patent anterior communicating arteries. Developmentally absent RIGHT A1 segment. Patent anterior cerebral artery is. Occluded LEFT middle cerebral artery with collateral vessels filling the posterior segments. RIGHT middle cerebral artery is widely patent. No contrast extravasation or aneurysm. POSTERIOR CIRCULATION: Patent bilateral vertebral artery's, mild luminal regularity old LEFT V4 segment most compatible with atherosclerosis. Patent basilar artery with moderate stenosis proximal basilar artery. Patent main branch vessels. Fetal origin RIGHT posterior cerebral artery. Patent bilateral posterior cerebral arteries with moderate tandem stenoses. No contrast extravasation or aneurysm. VENOUS SINUSES: Major dural venous sinuses are patent though not tailored for evaluation on this angiographic examination. ANATOMIC VARIANTS: Developmentally absent RIGHT A1 segment. Fetal origin RIGHT posterior cerebral artery. DELAYED PHASE: Not performed. MIP images reviewed. CT Brain Perfusion Findings: CBF (<30%) Volume: Perfusion (Tmax>6.0s) volume: Mismatch Volume: Infarction Location:LEFT frontotemporal lobes spanning the middle cerebral arteries. IMPRESSION: CTA NECK: Gradual loss of LEFT internal carotid artery contrast opacification most compatible with slow flow in distal occlusion. No hemodynamically significant stenosis. CTA HEAD: Acute LEFT MCA occlusion, collateral vessels filling posterior segments. Developmentally absent RIGHT A1 segment with thready LEFT A1 segment suggesting retrograde thrombosis to the carotid terminus. Moderate intracranial atherosclerosis. **An incidental finding of potential clinical significance has been found. 2.7 cm RIGHT thyroid nodule for which follow up thyroid sonogram is recommended on a nonemergent basis. This follows ACR consensus guidelines: Managing  Incidental Thyroid Nodules Detected on Imaging: White Paper of the ACR Incidental Thyroid Findings Committee. J Am Coll Radiol 2015; 12:143-150.** CT PERFUSION: Acute large LEFT MCA territory completed infarct with penumbra within the posterior margin of the LEFT MCA territory and LEFT anterior cerebral artery territory. Critical Value/emergent results were called by telephone at the time of interpretation on 07/28/2016 at 12:28 am to Dr. Ritta Slot , who verbally acknowledged these results. Electronically Signed   By: Awilda Metro M.D.   On: 07/28/2016 01:51   Ct Head Wo Contrast  Result Date: 07/27/2016 CLINICAL DATA:  Unwitnessed fall in the bathroom. Laceration to posterior scout with bleeding. EXAM: CT HEAD WITHOUT CONTRAST CT CERVICAL SPINE WITHOUT CONTRAST TECHNIQUE: Multidetector CT imaging of the head and cervical spine was performed following the standard protocol without intravenous contrast. Multiplanar CT image reconstructions of the cervical spine were also generated. COMPARISON:  None. FINDINGS: CT HEAD FINDINGS Brain: Diffuse cerebral atrophy. Ventricular dilatation consistent with central atrophy. Low-attenuation changes in the deep white matter consistent with small vessel ischemia. No mass effect or midline shift. No abnormal extra-axial fluid collections. Bas-white matter junctions are distinct. Basal cisterns are not effaced. No acute intracranial hemorrhage. Vascular: Vascular calcifications are present in the carotid siphons and vertebrobasilar arteries. Skull: The calvarium appears intact. Sinuses/Orbits: Mucosal thickening in the paranasal sinuses. No acute air-fluid levels. Mastoid air cells are not opacified. Other:  None. CT CERVICAL SPINE FINDINGS Alignment: Straightening of the usual cervical lordosis. This may be due to patient positioning or degenerative change but muscle spasm or ligamentous injury can also have this appearance and are not excluded. There is evidence  of basilar invagination of the odontoid process with degenerative changes at C1 to level. Skull base and vertebrae: There is a benign-appearing cyst in the odontoid process of C2. Degenerative changes at C1 to cause loss of the space between the anterior arch of C1 and the odontoid process. No vertebral compression deformities. No acute fractures are demonstrated. Soft tissues and spinal canal: No prevertebral fluid or swelling. No visible canal hematoma. Disc levels: Degenerative changes diffusely throughout the cervical spine with narrowed interspaces and endplate hypertrophic changes. Prominent disc osteophyte complexes cause some encroachment upon the central canal at C4-5, C5-6, and C6-7 levels. Degenerative changes throughout the facet joints. Upper chest: Evaluation is limited due to respiratory motion but there is evidence of atelectasis or consolidation in both lungs posteriorly. Other: Diffuse enlargement of the thyroid gland on the right. This is probably due to goiter. Vascular calcifications in the cervical carotid arteries. IMPRESSION: Head: No acute intracranial abnormalities. Chronic atrophy and small vessel ischemic changes. Cervical spine: Diffuse degenerative changes. Nonspecific straightening of usual cervical lordosis. Prominent degenerative changes at C1 to with mild basilar invagination. Bones cyst at the odontoid process of C2. No acute displaced fractures identified in the cervical spine. Atelectasis or consolidation demonstrated in the lung apices. Electronically Signed   By: Burman Nieves M.D.   On: 07/27/2016 23:26   Ct Angio Neck W Or Wo Contrast  Result Date: 07/28/2016 CLINICAL DATA:  Aphasia after fall in bathroom. EXAM: CT ANGIOGRAPHY HEAD AND NECK CT PERFUSION BRAIN TECHNIQUE: Multidetector CT imaging of the head and neck was performed using the standard protocol during bolus administration of intravenous contrast. Multiplanar CT image reconstructions and MIPs were obtained  to evaluate the vascular anatomy. Carotid stenosis measurements (when applicable) are obtained utilizing NASCET criteria, using the distal internal carotid diameter as the denominator. Multiphase CT imaging of the brain was performed following IV bolus contrast injection. Subsequent parametric perfusion maps were calculated using RAPID software. CONTRAST:  100 cc Isovue 370 COMPARISON:  CT HEAD July 27, 2016 at 2308 hours FINDINGS: CTA NECK AORTIC ARCH: Normal appearance of the thoracic arch, normal branch pattern. Mild calcific atherosclerosis of arch vessel origins. The origins of the innominate, left Common carotid artery and subclavian artery are widely patent. RIGHT CAROTID SYSTEM: Common carotid artery is widely patent, coursing in a straight line fashion. Normal appearance of the carotid bifurcation without hemodynamically significant stenosis by NASCET criteria. Tortuous internal carotid artery narrowing is patent. LEFT CAROTID SYSTEM: Common carotid artery is widely patent, coursing in a straight line fashion, minimal calcific atherosclerosis. Early carotid bifurcation without hemodynamically significant stenosis by NASCET criteria. Gradual decrease contrast opacification of LEFT cervical internal carotid artery most compatible with distal occlusion. VERTEBRAL ARTERIES:Left vertebral artery is dominant, mild stenosis of LEFT vertebral artery origin due to atherosclerosis. Bilateral vertebral arteries are patent, and mild extrinsic deformity due to degenerative cervical spine. SKELETON: No acute osseous process though bone windows have not been submitted. Multilevel severe degenerative discs. 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 OTHER NECK: Soft tissues of the neck are non-acute though, not tailored for evaluation. Dependent atelectasis. 2.7 cm heterogeneous RIGHT thyroid nodule. LEFT external jugular intravenous catheter. CTA HEAD ANTERIOR CIRCULATION: Complete loss of contrast  opacification LEFT internal carotid  artery through the level of the carotid terminus. RIGHT internal carotid artery is patent though, moderately stenotic at supraclinoid segment with mild poststenotic dilatation. Thready LEFT A1 segment with patent anterior communicating arteries. Developmentally absent RIGHT A1 segment. Patent anterior cerebral artery is. Occluded LEFT middle cerebral artery with collateral vessels filling the posterior segments. RIGHT middle cerebral artery is widely patent. No contrast extravasation or aneurysm. POSTERIOR CIRCULATION: Patent bilateral vertebral artery's, mild luminal regularity old LEFT V4 segment most compatible with atherosclerosis. Patent basilar artery with moderate stenosis proximal basilar artery. Patent main branch vessels. Fetal origin RIGHT posterior cerebral artery. Patent bilateral posterior cerebral arteries with moderate tandem stenoses. No contrast extravasation or aneurysm. VENOUS SINUSES: Major dural venous sinuses are patent though not tailored for evaluation on this angiographic examination. ANATOMIC VARIANTS: Developmentally absent RIGHT A1 segment. Fetal origin RIGHT posterior cerebral artery. DELAYED PHASE: Not performed. MIP images reviewed. CT Brain Perfusion Findings: CBF (<30%) Volume: Perfusion (Tmax>6.0s) volume: Mismatch Volume: Infarction Location:LEFT frontotemporal lobes spanning the middle cerebral arteries. IMPRESSION: CTA NECK: Gradual loss of LEFT internal carotid artery contrast opacification most compatible with slow flow in distal occlusion. No hemodynamically significant stenosis. CTA HEAD: Acute LEFT MCA occlusion, collateral vessels filling posterior segments. Developmentally absent RIGHT A1 segment with thready LEFT A1 segment suggesting retrograde thrombosis to the carotid terminus. Moderate intracranial atherosclerosis. **An incidental finding of potential clinical significance has been found. 2.7 cm RIGHT thyroid  nodule for which follow up thyroid sonogram is recommended on a nonemergent basis. This follows ACR consensus guidelines: Managing Incidental Thyroid Nodules Detected on Imaging: White Paper of the ACR Incidental Thyroid Findings Committee. J Am Coll Radiol 2015; 12:143-150.** CT PERFUSION: Acute large LEFT MCA territory completed infarct with penumbra within the posterior margin of the LEFT MCA territory and LEFT anterior cerebral artery territory. Critical Value/emergent results were called by telephone at the time of interpretation on 07/28/2016 at 12:28 am to Dr. Ritta Slot , who verbally acknowledged these results. Electronically Signed   By: Awilda Metro M.D.   On: 07/28/2016 01:51   Ct Cervical Spine Wo Contrast  Result Date: 07/27/2016 CLINICAL DATA:  Unwitnessed fall in the bathroom. Laceration to posterior scout with bleeding. EXAM: CT HEAD WITHOUT CONTRAST CT CERVICAL SPINE WITHOUT CONTRAST TECHNIQUE: Multidetector CT imaging of the head and cervical spine was performed following the standard protocol without intravenous contrast. Multiplanar CT image reconstructions of the cervical spine were also generated. COMPARISON:  None. FINDINGS: CT HEAD FINDINGS Brain: Diffuse cerebral atrophy. Ventricular dilatation consistent with central atrophy. Low-attenuation changes in the deep white matter consistent with small vessel ischemia. No mass effect or midline shift. No abnormal extra-axial fluid collections. Naas-white matter junctions are distinct. Basal cisterns are not effaced. No acute intracranial hemorrhage. Vascular: Vascular calcifications are present in the carotid siphons and vertebrobasilar arteries. Skull: The calvarium appears intact. Sinuses/Orbits: Mucosal thickening in the paranasal sinuses. No acute air-fluid levels. Mastoid air cells are not opacified. Other: None. CT CERVICAL SPINE FINDINGS Alignment: Straightening of the usual cervical lordosis. This may be due to patient  positioning or degenerative change but muscle spasm or ligamentous injury can also have this appearance and are not excluded. There is evidence of basilar invagination of the odontoid process with degenerative changes at C1 to level. Skull base and vertebrae: There is a benign-appearing cyst in the odontoid process of C2. Degenerative changes at C1 to cause loss of the space between the anterior arch of C1 and the odontoid process. No  vertebral compression deformities. No acute fractures are demonstrated. Soft tissues and spinal canal: No prevertebral fluid or swelling. No visible canal hematoma. Disc levels: Degenerative changes diffusely throughout the cervical spine with narrowed interspaces and endplate hypertrophic changes. Prominent disc osteophyte complexes cause some encroachment upon the central canal at C4-5, C5-6, and C6-7 levels. Degenerative changes throughout the facet joints. Upper chest: Evaluation is limited due to respiratory motion but there is evidence of atelectasis or consolidation in both lungs posteriorly. Other: Diffuse enlargement of the thyroid gland on the right. This is probably due to goiter. Vascular calcifications in the cervical carotid arteries. IMPRESSION: Head: No acute intracranial abnormalities. Chronic atrophy and small vessel ischemic changes. Cervical spine: Diffuse degenerative changes. Nonspecific straightening of usual cervical lordosis. Prominent degenerative changes at C1 to with mild basilar invagination. Bones cyst at the odontoid process of C2. No acute displaced fractures identified in the cervical spine. Atelectasis or consolidation demonstrated in the lung apices. Electronically Signed   By: Burman Nieves M.D.   On: 07/27/2016 23:26   Dg Pelvis Portable  Result Date: 07/27/2016 CLINICAL DATA:  Trauma.  Unwitnessed fall tonight. EXAM: PORTABLE PELVIS 1-2 VIEWS COMPARISON:  None. FINDINGS: The patient is rotated. Diffuse bone demineralization. There is no  evidence of pelvic fracture or diastasis. No pelvic bone lesions are seen. Degenerative changes in the hips. IMPRESSION: No acute bony abnormalities.  Degenerative changes in the hips. Electronically Signed   By: Burman Nieves M.D.   On: 07/27/2016 23:15   Ct Cerebral Perfusion W Contrast  Result Date: 07/28/2016 CLINICAL DATA:  Aphasia after fall in bathroom. EXAM: CT ANGIOGRAPHY HEAD AND NECK CT PERFUSION BRAIN TECHNIQUE: Multidetector CT imaging of the head and neck was performed using the standard protocol during bolus administration of intravenous contrast. Multiplanar CT image reconstructions and MIPs were obtained to evaluate the vascular anatomy. Carotid stenosis measurements (when applicable) are obtained utilizing NASCET criteria, using the distal internal carotid diameter as the denominator. Multiphase CT imaging of the brain was performed following IV bolus contrast injection. Subsequent parametric perfusion maps were calculated using RAPID software. CONTRAST:  100 cc Isovue 370 COMPARISON:  CT HEAD July 27, 2016 at 2308 hours FINDINGS: CTA NECK AORTIC ARCH: Normal appearance of the thoracic arch, normal branch pattern. Mild calcific atherosclerosis of arch vessel origins. The origins of the innominate, left Common carotid artery and subclavian artery are widely patent. RIGHT CAROTID SYSTEM: Common carotid artery is widely patent, coursing in a straight line fashion. Normal appearance of the carotid bifurcation without hemodynamically significant stenosis by NASCET criteria. Tortuous internal carotid artery narrowing is patent. LEFT CAROTID SYSTEM: Common carotid artery is widely patent, coursing in a straight line fashion, minimal calcific atherosclerosis. Early carotid bifurcation without hemodynamically significant stenosis by NASCET criteria. Gradual decrease contrast opacification of LEFT cervical internal carotid artery most compatible with distal occlusion. VERTEBRAL ARTERIES:Left  vertebral artery is dominant, mild stenosis of LEFT vertebral artery origin due to atherosclerosis. Bilateral vertebral arteries are patent, and mild extrinsic deformity due to degenerative cervical spine. SKELETON: No acute osseous process though bone windows have not been submitted. Multilevel severe degenerative discs. 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 OTHER NECK: Soft tissues of the neck are non-acute though, not tailored for evaluation. Dependent atelectasis. 2.7 cm heterogeneous RIGHT thyroid nodule. LEFT external jugular intravenous catheter. CTA HEAD ANTERIOR CIRCULATION: Complete loss of contrast opacification LEFT internal carotid artery through the level of the carotid terminus. RIGHT internal carotid artery is patent though,  moderately stenotic at supraclinoid segment with mild poststenotic dilatation. Thready LEFT A1 segment with patent anterior communicating arteries. Developmentally absent RIGHT A1 segment. Patent anterior cerebral artery is. Occluded LEFT middle cerebral artery with collateral vessels filling the posterior segments. RIGHT middle cerebral artery is widely patent. No contrast extravasation or aneurysm. POSTERIOR CIRCULATION: Patent bilateral vertebral artery's, mild luminal regularity old LEFT V4 segment most compatible with atherosclerosis. Patent basilar artery with moderate stenosis proximal basilar artery. Patent main branch vessels. Fetal origin RIGHT posterior cerebral artery. Patent bilateral posterior cerebral arteries with moderate tandem stenoses. No contrast extravasation or aneurysm. VENOUS SINUSES: Major dural venous sinuses are patent though not tailored for evaluation on this angiographic examination. ANATOMIC VARIANTS: Developmentally absent RIGHT A1 segment. Fetal origin RIGHT posterior cerebral artery. DELAYED PHASE: Not performed. MIP images reviewed. CT Brain Perfusion Findings: CBF (<30%) Volume: Perfusion (Tmax>6.0s) volume:  Mismatch Volume: Infarction Location:LEFT frontotemporal lobes spanning the middle cerebral arteries. IMPRESSION: CTA NECK: Gradual loss of LEFT internal carotid artery contrast opacification most compatible with slow flow in distal occlusion. No hemodynamically significant stenosis. CTA HEAD: Acute LEFT MCA occlusion, collateral vessels filling posterior segments. Developmentally absent RIGHT A1 segment with thready LEFT A1 segment suggesting retrograde thrombosis to the carotid terminus. Moderate intracranial atherosclerosis. **An incidental finding of potential clinical significance has been found. 2.7 cm RIGHT thyroid nodule for which follow up thyroid sonogram is recommended on a nonemergent basis. This follows ACR consensus guidelines: Managing Incidental Thyroid Nodules Detected on Imaging: White Paper of the ACR Incidental Thyroid Findings Committee. J Am Coll Radiol 2015; 12:143-150.** CT PERFUSION: Acute large LEFT MCA territory completed infarct with penumbra within the posterior margin of the LEFT MCA territory and LEFT anterior cerebral artery territory. Critical Value/emergent results were called by telephone at the time of interpretation on 07/28/2016 at 12:28 am to Dr. Ritta Slot , who verbally acknowledged these results. Electronically Signed   By: Awilda Metro M.D.   On: 07/28/2016 01:51   Dg Chest Port 1 View  Result Date: 07/27/2016 CLINICAL DATA:  Unwitnessed fall. Tachycardia and shortness of breath. EXAM: PORTABLE CHEST 1 VIEW COMPARISON:  None. FINDINGS: Shallow inspiration. Heart size and pulmonary vascularity are normal for technique. Tortuous and dilated aorta. Mediastinum is prominent. This could just be due to shallow inspiration and portable technique but mediastinal injury is not excluded. No focal airspace disease or consolidation in the lungs. No blunting of costophrenic angles. No pneumothorax. Visualize ribs appear intact. Postoperative changes in both  shoulders. IMPRESSION: Shallow inspiration. No evidence of active pulmonary disease. Prominence of the mediastinum is likely due to technique and dilated aorta but mediastinal injury not excluded. Electronically Signed   By: Burman Nieves M.D.   On: 07/27/2016 23:17     EKG: Independently reviewed. Rate 101, QTc 493.  Artifact in baseline.    Assessment/Plan  1. Acute Stroke:  This is new.  Family confirm that patient would have wanted to limit invasive or uncomfortable testing and focus on comfort measures only.  However, they appear very apprehensive about her overnight status, and so I will order those forms of monitoring that I feel will best balance her dignity/comfort with the ability to track her trajectory overnight (in particular telemetry and neuro checks overnight), but will defer further workup, aspirin, lab work, or imaging. -Admit to telemetry -Neuro checks  -Permissive hypertension for now -Will pend PT/OT/SLP consultation in case patient is more alert in next 24 hours -Consult to Neurology, appreciate recommendations -Comfort  measures:   avoid BP checks, reposition q2hrs, no IV fluids for now, morphine for pain, ondansetron for nausea, Consult to palliative care    2. HTN:  -Permissive hypertension for now, hold Telmisartan-HCT          DVT prophylaxis: None, comfort measures  Code Status: DO NOT RESUSCITATE  Family Communication: Son, daughters, granddaughters, and family friends at bedside.  CODE STATUS confirmed.  Overnight plan for neuro checks  Disposition Plan: Anticipate close monitoring as above and consult to ancillary services. Consult to Palliative care tomorrow.  Further disposition pending clinical course. Consults called: Neurology, Dr. Amada Jupiter has seen patient. Admission status: Telemetry, INPATIENT status  Core measures: Deferred given comfort measures only.      Medical decision making: Patient seen at 1:15 AM on 07/28/2016.  The  patient was discussed with Dr. Amada Jupiter and Dr. Moody Bruins. What exists of the patient's chart was reviewed in depth and summarized above.  Clinical condition: at present hemodynamically stable, overall prognosis poor, possibly in hospital death.       Alberteen Sam [facility] Pager [phone]

## 2016-07-28 NOTE — Progress Notes (Signed)
OT Cancellation Note  Patient Details Name: Sandra Dalton MRN: [id number] DOB: 01/20/1925   Cancelled Treatment:    Reason Eval/Treat Not Completed: Medical issues which prohibited therapy. Pt/family proceding with comfort care measures. Per MD, therapy appropriate to sign off at this time. Please re-consult if needs change. OT will sign off.  Doristine Sectionharity A Tollie Canada, MS OTR/L  Pager: [phone]   Laval Cafaro A Porcha Deblanc 07/28/2016, 4:00 PM

## 2016-07-28 NOTE — Progress Notes (Signed)
SLP Cancellation Note  Patient Details Name: Sandra Dalton MRN: [id number] DOB: 05/01/1925   Cancelled treatment:       Reason Eval/Treat Not Completed: Patient's level of consciousness; family opting for comfort care per Dr. Warren DanesXu's note.  Our services will sign off.    Blenda MountsCouture, Trevion Hoben Laurice 07/28/2016, 3:09 PM

## 2016-07-28 NOTE — Progress Notes (Addendum)
Patient arrived to unit via ED staff with family at bedside.  Patient not able to follow commands; family oriented to unit/room. Tele applied to patient and verified.  Admission done per family.  Continue to monitor patient.

## 2016-07-28 NOTE — Progress Notes (Signed)
Mrs. Sandra Dalton is a 83 yo female coming via [facility] from home. She is normally independent and lives alone. Approx 2100 pt found down lying in a pool of blood in her bathroom. LKW 1815 which is the last time she spoke with a family member. Originally came in as a level 1 trauma but was cancelled after arriving to ED where pt was noted to have right sided plegia and aphasia. NIHSS 22.

## 2016-07-28 NOTE — ED Provider Notes (Signed)
[facility]-EMERGENCY DEPT Provider Note   CSN: [id number] Arrival date & time: 07/27/16  2240   An emergency department physician performed an initial assessment on this suspected stroke patient at 2324.  History   Chief Complaint Chief Complaint  Patient presents with  . Trauma    HPI MADELIENE Dalton is a 83 y.o. female.  HPI Sandra Dalton is a 83 y/o F who comes in with cc of fall.  LEVEL 5 CAVEAT FOR ALTERED MENTAL STATUS.  Pt comes in as level 1 trauma activation. Per EMS, pt was found down on the floor by a family member. Pt is normally active, functional and alert and lives independently. She was last normal at 5:30 pm. Pt was found on the floor with blood  around her. Family on way - pt full code for now.  No past medical history on file.  Patient Active Problem List   Diagnosis Date Noted  . Acute ischemic right MCA stroke (HCC) 07/28/2016    No past surgical history on file.  OB History    No data available       Home Medications    Prior to Admission medications   Not on File    Family History No family history on file.  Social History Social History  Substance Use Topics  . Smoking status: Not on file  . Smokeless tobacco: Not on file  . Alcohol use Not on file     Allergies   Patient has no allergy information on record.   Review of Systems Review of Systems  Unable to perform ROS: Patient unresponsive     Physical Exam Updated Vital Signs BP 145/87   Pulse 93   Temp 97.3 F (36.3 C) (Oral)   Resp 23   SpO2 98%   Physical Exam  Constitutional: She appears well-developed.  HENT:  Hematoma on the posterior side of the head  Eyes: EOM are normal.  L sided gaze  Neck:  C-collar placed  Cardiovascular: Normal rate.   Pulmonary/Chest: Effort normal.  + gag reflex  Abdominal: Soft.  Neurological:  GCS: 9 E: 3 V: 1 M: 5  Pt has R sided droop. Pt has R sided upper extremity weakness. She is moving all 4 extremities. Pt has a L  sided gaze.   Skin: Skin is warm and dry.  Nursing note and vitals reviewed.    ED Treatments / Results  Labs (all labs ordered are listed, but only abnormal results are displayed) Labs Reviewed  COMPREHENSIVE METABOLIC PANEL - Abnormal; Notable for the following:       Result Value   Chloride 99 (*)    Glucose, Bld 180 (*)    BUN 29 (*)    Creatinine, Ser 1.21 (*)    Calcium 10.4 (*)    GFR calc non Af Amer 38 (*)    GFR calc Af Amer 44 (*)    All other components within normal limits  CBC - Abnormal; Notable for the following:    WBC 11.9 (*)    All other components within normal limits  CK TOTAL AND CKMB (NOT AT [facility]) - Abnormal; Notable for the following:    Relative Index 3.5 (*)    All other components within normal limits  I-STAT CHEM 8, ED - Abnormal; Notable for the following:    BUN 32 (*)    Creatinine, Ser 1.20 (*)    Glucose, Bld 182 (*)    All other components within normal limits  I-STAT CG4 LACTIC ACID, ED - Abnormal; Notable for the following:    Lactic Acid, Venous 1.94 (*)    All other components within normal limits  CBG MONITORING, ED - Abnormal; Notable for the following:    Glucose-Capillary 177 (*)    All other components within normal limits  CDS SEROLOGY  ETHANOL  PROTIME-INR  URINALYSIS, ROUTINE W REFLEX MICROSCOPIC  TYPE AND SCREEN  PREPARE FRESH FROZEN PLASMA  ABO/RH    EKG  EKG Interpretation None      ED ECG REPORT   Date: 07/28/2016  Rate: TACHYCARDIA  Rhythm: normal sinus rhythm  QRS Axis: normal  Intervals: normal  ST/T Wave abnormalities: nonspecific ST/T changes  Conduction Disutrbances:none  Narrative Interpretation:   Old EKG Reviewed: none available  I have personally reviewed the EKG tracing and agree with the computerized printout as noted.   Radiology Ct Head Wo Contrast  Result Date: 07/27/2016 CLINICAL DATA:  Unwitnessed fall in the bathroom. Laceration to posterior scout with bleeding. EXAM: CT HEAD  WITHOUT CONTRAST CT CERVICAL SPINE WITHOUT CONTRAST TECHNIQUE: Multidetector CT imaging of the head and cervical spine was performed following the standard protocol without intravenous contrast. Multiplanar CT image reconstructions of the cervical spine were also generated. COMPARISON:  None. FINDINGS: CT HEAD FINDINGS Brain: Diffuse cerebral atrophy. Ventricular dilatation consistent with central atrophy. Low-attenuation changes in the deep white matter consistent with small vessel ischemia. No mass effect or midline shift. No abnormal extra-axial fluid collections. Neuzil-white matter junctions are distinct. Basal cisterns are not effaced. No acute intracranial hemorrhage. Vascular: Vascular calcifications are present in the carotid siphons and vertebrobasilar arteries. Skull: The calvarium appears intact. Sinuses/Orbits: Mucosal thickening in the paranasal sinuses. No acute air-fluid levels. Mastoid air cells are not opacified. Other: None. CT CERVICAL SPINE FINDINGS Alignment: Straightening of the usual cervical lordosis. This may be due to patient positioning or degenerative change but muscle spasm or ligamentous injury can also have this appearance and are not excluded. There is evidence of basilar invagination of the odontoid process with degenerative changes at C1 to level. Skull base and vertebrae: There is a benign-appearing cyst in the odontoid process of C2. Degenerative changes at C1 to cause loss of the space between the anterior arch of C1 and the odontoid process. No vertebral compression deformities. No acute fractures are demonstrated. Soft tissues and spinal canal: No prevertebral fluid or swelling. No visible canal hematoma. Disc levels: Degenerative changes diffusely throughout the cervical spine with narrowed interspaces and endplate hypertrophic changes. Prominent disc osteophyte complexes cause some encroachment upon the central canal at C4-5, C5-6, and C6-7 levels. Degenerative changes  throughout the facet joints. Upper chest: Evaluation is limited due to respiratory motion but there is evidence of atelectasis or consolidation in both lungs posteriorly. Other: Diffuse enlargement of the thyroid gland on the right. This is probably due to goiter. Vascular calcifications in the cervical carotid arteries. IMPRESSION: Head: No acute intracranial abnormalities. Chronic atrophy and small vessel ischemic changes. Cervical spine: Diffuse degenerative changes. Nonspecific straightening of usual cervical lordosis. Prominent degenerative changes at C1 to with mild basilar invagination. Bones cyst at the odontoid process of C2. No acute displaced fractures identified in the cervical spine. Atelectasis or consolidation demonstrated in the lung apices. Electronically Signed   By: Burman NievesWilliam  Stevens M.D.   On: 07/27/2016 23:26   Ct Cervical Spine Wo Contrast  Result Date: 07/27/2016 CLINICAL DATA:  Unwitnessed fall in the bathroom. Laceration to posterior scout with bleeding. EXAM: CT HEAD  WITHOUT CONTRAST CT CERVICAL SPINE WITHOUT CONTRAST TECHNIQUE: Multidetector CT imaging of the head and cervical spine was performed following the standard protocol without intravenous contrast. Multiplanar CT image reconstructions of the cervical spine were also generated. COMPARISON:  None. FINDINGS: CT HEAD FINDINGS Brain: Diffuse cerebral atrophy. Ventricular dilatation consistent with central atrophy. Low-attenuation changes in the deep white matter consistent with small vessel ischemia. No mass effect or midline shift. No abnormal extra-axial fluid collections. Davtyan-white matter junctions are distinct. Basal cisterns are not effaced. No acute intracranial hemorrhage. Vascular: Vascular calcifications are present in the carotid siphons and vertebrobasilar arteries. Skull: The calvarium appears intact. Sinuses/Orbits: Mucosal thickening in the paranasal sinuses. No acute air-fluid levels. Mastoid air cells are not  opacified. Other: None. CT CERVICAL SPINE FINDINGS Alignment: Straightening of the usual cervical lordosis. This may be due to patient positioning or degenerative change but muscle spasm or ligamentous injury can also have this appearance and are not excluded. There is evidence of basilar invagination of the odontoid process with degenerative changes at C1 to level. Skull base and vertebrae: There is a benign-appearing cyst in the odontoid process of C2. Degenerative changes at C1 to cause loss of the space between the anterior arch of C1 and the odontoid process. No vertebral compression deformities. No acute fractures are demonstrated. Soft tissues and spinal canal: No prevertebral fluid or swelling. No visible canal hematoma. Disc levels: Degenerative changes diffusely throughout the cervical spine with narrowed interspaces and endplate hypertrophic changes. Prominent disc osteophyte complexes cause some encroachment upon the central canal at C4-5, C5-6, and C6-7 levels. Degenerative changes throughout the facet joints. Upper chest: Evaluation is limited due to respiratory motion but there is evidence of atelectasis or consolidation in both lungs posteriorly. Other: Diffuse enlargement of the thyroid gland on the right. This is probably due to goiter. Vascular calcifications in the cervical carotid arteries. IMPRESSION: Head: No acute intracranial abnormalities. Chronic atrophy and small vessel ischemic changes. Cervical spine: Diffuse degenerative changes. Nonspecific straightening of usual cervical lordosis. Prominent degenerative changes at C1 to with mild basilar invagination. Bones cyst at the odontoid process of C2. No acute displaced fractures identified in the cervical spine. Atelectasis or consolidation demonstrated in the lung apices. Electronically Signed   By: Burman Nieves M.D.   On: 07/27/2016 23:26   Dg Pelvis Portable  Result Date: 07/27/2016 CLINICAL DATA:  Trauma.  Unwitnessed fall  tonight. EXAM: PORTABLE PELVIS 1-2 VIEWS COMPARISON:  None. FINDINGS: The patient is rotated. Diffuse bone demineralization. There is no evidence of pelvic fracture or diastasis. No pelvic bone lesions are seen. Degenerative changes in the hips. IMPRESSION: No acute bony abnormalities.  Degenerative changes in the hips. Electronically Signed   By: Burman Nieves M.D.   On: 07/27/2016 23:15   Dg Chest Port 1 View  Result Date: 07/27/2016 CLINICAL DATA:  Unwitnessed fall. Tachycardia and shortness of breath. EXAM: PORTABLE CHEST 1 VIEW COMPARISON:  None. FINDINGS: Shallow inspiration. Heart size and pulmonary vascularity are normal for technique. Tortuous and dilated aorta. Mediastinum is prominent. This could just be due to shallow inspiration and portable technique but mediastinal injury is not excluded. No focal airspace disease or consolidation in the lungs. No blunting of costophrenic angles. No pneumothorax. Visualize ribs appear intact. Postoperative changes in both shoulders. IMPRESSION: Shallow inspiration. No evidence of active pulmonary disease. Prominence of the mediastinum is likely due to technique and dilated aorta but mediastinal injury not excluded. Electronically Signed   By: Marisa Cyphers.D.  On: 07/27/2016 23:17    Procedures Procedures (including critical care time)  CRITICAL CARE Performed by: Derwood Kaplan   Total critical care time: 85 minutes  Critical care time was exclusive of separately billable procedures and treating other patients.  Critical care was necessary to treat or prevent imminent or life-threatening deterioration.  Critical care was time spent personally by me on the following activities: development of treatment plan with patient and/or surrogate as well as nursing, discussions with consultants, evaluation of patient's response to treatment, examination of patient, obtaining history from patient or surrogate, ordering and performing treatments and  interventions, ordering and review of laboratory studies, ordering and review of radiographic studies, pulse oximetry and re-evaluation of patient's condition.   Medications Ordered in ED Medications  iopamidol (ISOVUE-370) 76 % injection (100 mLs  Contrast Given 07/27/16 2342)     Initial Impression / Assessment and Plan / ED Course  I have reviewed the triage vital signs and the nursing notes.  Pertinent labs & imaging results that were available during my care of the patient were reviewed by me and considered in my medical decision making (see chart for details).     Pt comes in with cc of level 1 trauma and a fall.  TRAUMA ACTIVATION was downgraded to LEVEL 2. Trauma at bedside and agrees. Based on initial exam, we think that pt either has an acute brain bleed or stroke. Pt was protecting airway initially. We decided not to intubate and get a CT brain. Ct showed no acute bleed. Code stroke called and Neuro to bedside immediately. CT-A ordered. Pt started having some difficulty controlling secretion at the time CT-A ordered - but neuro didn't want to delay the study / intervention. Also - we wanted to have further goals of care discussion with family at that time.  CT-A done and showed severe infarction of the L MCA, not amenable to intervention. Neurology team and we discussed the poor prognosis, and Neurology later discussed the findings and expected poor outcome with family. Family decided to keep patient comfort care only. Head at 30 degrees. We will admit.  Final Clinical Impressions(s) / ED Diagnoses   Final diagnoses:  Acute ischemic left MCA stroke [facility])    New Prescriptions New Prescriptions   No medications on file     Derwood Kaplan, MD 07/28/16 0120

## 2016-07-28 NOTE — Progress Notes (Addendum)
PT Cancellation Note  Patient Details Name: Sandra Dalton MRN: [id number] DOB: 10/29/1924   Cancelled Treatment:    Reason Eval/Treat Not Completed: Medical issues which prohibited therapy.   PT eval received, chart reviewed. Per admitting MD's note, appears that PT consult will be pending pt improvement. Noted that family to have a meeting with Palliative Medicine this afternoon. Please indicate in Palliative note if PT will be appropriate to follow up tomorrow. Thank you.   Marylynn PearsonLaura D Kadiatou Oplinger 07/28/2016, 12:22 PM   Addendum: Discussed pt case with MD on 07/28/16 at 1430 who states that therapy is appropriate to sign off at this time. If needs change, please reconsult. Will alert OT.   Conni SlipperLaura Dwight Burdo, PT, DPT Acute Rehabilitation Services Pager: [phone]

## 2016-07-28 NOTE — Progress Notes (Signed)
Palliative Medicine RN Note: Afternoon follow up with family. Pt is less responsive to me but smiles when grandchildren talk to her. All four children present, as well as multiple siblings and grandchildren (including one granddaughter who is an Charity fundraiserN). They have had long family conversations and agree that they would like pt to go to inpatient hospice. Offered choice of local facilities; family reports that they have spoken to friends and request [facility]. I called [facility]; they will meet tomorrow around noon.   Pt has been having pain per family; discussed option of scheduled medication; agree that would be beneficial. Dr Phillips OdorGolding gave order for scheduled morphine and prn robinul. Plan f/u by PMT RN in the am.   Margret ChanceMelanie G. Brodie Scovell, RN, BSN, [facility] 07/28/2016 4:23 PM Cell [phone] 8:00-4:00 Monday-Friday Office [phone]

## 2016-07-28 NOTE — ED Notes (Signed)
Patient returned to room and placed back on monitor.  

## 2016-07-29 DIAGNOSIS — Z515 Encounter for palliative care: Secondary | ICD-10-CM

## 2016-07-29 MED ORDER — SODIUM CHLORIDE 0.9 % IV SOLN: 1.0000 mg/h | INTRAVENOUS | Status: AC

## 2016-07-29 MED ORDER — MORPHINE BOLUS VIA INFUSION
2.0000 mg | INTRAVENOUS | Status: DC | PRN
Start: 2016-07-29 — End: 2016-07-29
  Filled 2016-07-29: qty 2

## 2016-07-29 MED ORDER — LORAZEPAM 2 MG/ML IJ SOLN
0.5000 mg | Freq: Two times a day (BID) | INTRAMUSCULAR | Status: DC
  Filled 2016-07-29: qty 1

## 2016-07-29 MED ORDER — MORPHINE SULFATE (PF) 2 MG/ML IV SOLN
2.0000 mg | INTRAVENOUS | Status: DC
  Administered 2016-07-29: 2 mg via INTRAVENOUS
  Filled 2016-07-29: qty 1

## 2016-07-29 MED ORDER — LORAZEPAM 2 MG/ML IJ SOLN: 0.5000 mg | Freq: Four times a day (QID) | INTRAMUSCULAR | 0 refills | Status: AC | PRN

## 2016-07-29 MED ORDER — SODIUM CHLORIDE 0.9 % IV SOLN
1.0000 mg/h | INTRAVENOUS | Status: DC
  Filled 2016-07-29: qty 10

## 2016-07-29 MED ORDER — MORPHINE SULFATE (PF) 2 MG/ML IV SOLN
2.0000 mg | INTRAVENOUS | Status: DC | PRN
  Administered 2016-07-29: 2 mg via INTRAVENOUS
  Filled 2016-07-29: qty 1

## 2016-07-29 MED ORDER — MORPHINE SULFATE (PF) 2 MG/ML IV SOLN
1.0000 mg | INTRAVENOUS | Status: DC | PRN
  Administered 2016-07-29: 1 mg via INTRAVENOUS
  Filled 2016-07-29: qty 1

## 2016-07-29 NOTE — Consult Note (Signed)
Consultation Note Date: 07/29/2016   Patient Name: Sandra Dalton  DOB: 04/27/1925  MRN: [id number]  Age / Sex: 83 y.o., female  PCP: Kirby Funk, MD Referring Physician: Penny Pia, MD  Reason for Consultation: Establishing goals of care, Inpatient hospice referral, Non pain symptom management, Pain control, Psychosocial/spiritual support and Terminal Care  HPI/Patient Profile: 83 yo woman previouisly healthy and independent who was found down for unknown period of time with large L MCA stroke. Given her advanced age and severity of presentation family elected for comfort care. They are actively grieving and coping with the conditions of impending death. This was unexpected and not the circumstances they would have chosen clearly.  The patient is described as fiercely independent and was very active with her friends and in the community.   Clinical Assessment and Goals of Care: Patient is actively dying. Unresponsive, slightly agitated. Family struggling with details of her care and carry with them a difficult experience at this hospital with their father dying and do have some inherent mistrust in the healthcare delivery system and are somewhat on edge and guarded about her care- they want to fight for their moms comfort and dignity-I provided much reassurance. They are anxious with good reason and trying to make the best decisions possible for her.  They expressed reservations about morphine, the foley and seizure medication-but after thoughtful discussion we will place a foley, start a continuous infusion and give seizure prophylaxis.   HOPT meeting with them now.  Hospice facility would be ideal for her EOL care.  HCPOA-son Eddie    SUMMARY OF RECOMMENDATIONS     Full comfort  Morphine infusion with bolus  Scheduled BID ativan for seizure prophylaxis  Place foley for comfort  Hospice Facility  for EOL care    Prognosis:   Hours - Days  Discharge Planning: Hospice facility      Primary Diagnoses: Present on Admission: . Acute ischemic left MCA stroke (HCC) . Essential hypertension   I have reviewed the medical record, interviewed the patient and family, and examined the patient. The following aspects are pertinent.  Past Medical History:  Diagnosis Date  . Hypertension    Social History   Social History  . Marital status: Widowed    Spouse name: N/A  . Number of children: N/A  . Years of education: N/A   Social History Main Topics  . Smoking status: Never Smoker  . Smokeless tobacco: Never Used  . Alcohol use None  . Drug use: Unknown  . Sexual activity: Not Asked   Other Topics Concern  . None   Social History Narrative  . None   Family History  Problem Relation Age of Onset  . Stroke Mother    Scheduled Meds: . LORazepam  0.5 mg Intravenous q12n4p  . mouth rinse  15 mL Mouth Rinse BID   Continuous Infusions: . morphine     PRN Meds:.glycopyrrolate, haloperidol lactate, LORazepam, morphine, ondansetron (ZOFRAN) IV Medications Prior to Admission:  Prior to Admission medications  Medication Sig Start Date End Date Taking? Authorizing Provider  valsartan-hydrochlorothiazide (DIOVAN-HCT) 160-12.5 MG tablet Take 1 tablet by mouth daily.   Yes Historical Provider, MD   No Known Allergies Review of Systems  Physical Exam  Vital Signs: BP 135/73 (BP Location: Left Arm)   Pulse (!) 58   Temp 97.8 F (36.6 C) (Axillary)   Resp 14   Ht 5\' 5"  (1.651 m)   Wt 60.3 kg (132 lb 15 oz)   SpO2 100%   BMI 22.12 kg/m  Pain Assessment: PAINAD   Pain Score: 0-No pain   SpO2: SpO2: 100 % O2 Device:SpO2: 100 % O2 Flow Rate: .O2 Flow Rate (L/min): 4 L/min  IO: Intake/output summary:  Intake/Output Summary (Last 24 hours) at 07/29/16 1248 Last data filed at 07/28/16 1300  Gross per 24 hour  Intake                0 ml  Output                0  ml  Net                0 ml    LBM: Last BM Date:  (pta; family does not know as she lived alone) Baseline Weight: Weight: 60.3 kg (132 lb 15 oz) Most recent weight: Weight: 60.3 kg (132 lb 15 oz)     Palliative Assessment/Data:   Flowsheet Rows     Most Recent Value  Intake Tab  Unit at Time of Referral  Other (Comment)  Palliative Care Primary Diagnosis  Neurology  Date Notified  07/28/16  Palliative Care Type  New Palliative care  Reason for referral  End of Life Care Assistance  Date of Admission  07/27/16  Date first seen by Palliative Care  07/28/16  # of days Palliative referral response time  0 Day(s)  # of days IP prior to Palliative referral  1  Clinical Assessment  Palliative Performance Scale Score  10%  Pain Max last 24 hours  Not able to report  Pain Min Last 24 hours  Not able to report  Dyspnea Max Last 24 Hours  Not able to report  Dyspnea Min Last 24 hours  Not able to report  Nausea Max Last 24 Hours  Not able to report  Nausea Min Last 24 Hours  Not able to report  Anxiety Max Last 24 Hours  Not able to report  Anxiety Min Last 24 Hours  Not able to report  Psychosocial & Spiritual Assessment  Palliative Care Outcomes  Patient/Family meeting held?  Yes  Who was at the meeting?  2 sons, daughter, son in law  Palliative Care Outcomes  Counseled regarding hospice      Time Total: 70 minutes Greater than 50%  of this time was spent counseling and coordinating care related to the above assessment and plan.  Signed by: Anderson MaltaGOLDING,ELIZABETH, DO   Please contact Palliative Medicine Team phone at [phone] for questions and concerns.  For individual provider: See Loretha StaplerAmion

## 2016-07-29 NOTE — Progress Notes (Signed)
Palliative Medicine RN Note: Checked on pt prior to transfer. Family refused morphine drip. Obtained order for prn morphine to hold her over until [facility] arrives (they are running late, and it may be 1-2 hours). Family denies further questions or needs.   Margret ChanceMelanie G. Alisa Stjames, RN, BSN, [facility] 07/29/2016 3:32 PM Cell [phone] 8:00-4:00 Monday-Friday Office [phone]

## 2016-07-29 NOTE — Care Management Note (Signed)
Case Management Note  Patient Details  Name: Sandra Dalton MRN: [id number] Date of Birth: 12/24/1924  Subjective/Objective:                    Action/Plan: [facility]. No further needs per CM.   Expected Discharge Date:  07/29/16               Expected Discharge Plan:  Hospice Medical Facility  In-House Referral:  Clinical Social Work  Discharge planning Services     Post Acute Care Choice:    Choice offered to:     DME Arranged:    DME Agency:     HH Arranged:    HH Agency:     Status of Service:  Completed, signed off  If discussed at [employer] Meetings, dates discussed:    Additional Comments:  Kermit BaloKelli F Marella Vanderpol, RN 07/29/2016, 2:49 PM

## 2016-07-29 NOTE — Progress Notes (Signed)
Clinical Social Worker was contacted by the admission coordinator of [facility] (Nelson Chimesiana Stevens). Lafonda MossesDiana stated she has reached out to the family and the family has agreed to send patient to hospice. Lafonda MossesDiana stated everything for patient was taken care of and requested [facility] be called for patient transport.   Clinical information faxed to facility and family agreeable with plan.  CS[facility]    Clinical Social Worker will sign off for now as social work intervention is no longer needed. Please consult us again if new need arises.  Marrianne MoodAshley Chananya Canizalez, MSW, LCSWA [phone]   Marrianne MoodAshley Bertie Simien, MSW,  Theresia MajorsLCSWA [phone]

## 2016-07-29 NOTE — Discharge Summary (Addendum)
Physician Discharge Summary  Sandra Dalton UXL:[id number] DOB: 02/15/1925 DOA: 07/27/2016  PCP: Lillia MountainGRIFFIN,JOHN JOSEPH, MD  Admit date: 07/27/2016 Discharge date: 07/29/2016  Time spent: 36 minutes  Recommendations for Outpatient Follow-up:  1. Continue comfort measures   Discharge Diagnoses:  Principal Problem:   Acute ischemic left MCA stroke [facility](HCC) Active Problems:   Essential hypertension   Aphasia   Palliative care by specialist   Discharge Condition: stable  Diet recommendation: npo  Filed Weights   07/28/16 0240  Weight: 60.3 kg (132 lb 15 oz)    History of present illness:  83 y.o. female with a past medical history significant for HTN who presents with stroke.  All history collected from the patient's family, as the patient is unresponsive.  Patient was in her usual state of health (lives independently, new to family members, independent with all ADLs, no dementia, very active). Family spoke with her by phone on 6:15, at around 10 PM, a family member came by the house to check on her, found her in the bathroom on the floor, collapsed, not moving her right side, unable to speak, with left gaze preference. She was transported to the ER by EMS  Hospital Course:  Large L MCA stroke - left patient unable to eat and bedbound. Given prognosis comfort measures employed and patient will be transitioned to Hospice for EOL care.   Procedures:  None  Consultations:  Palliative care  Discharge Exam: Vitals:   07/29/16 0630 07/29/16 1259  BP:  (!) 116/51  Pulse:  (!) 53  Resp: 14 16  Temp:  99.4 F (37.4 C)    General: Pt in nad Cardiovascular: no cyanosis Respiratory: no increased wob, no wheezes  Discharge Instructions   Discharge Instructions    Call MD for:  persistant nausea and vomiting    Complete by:  As directed    Call MD for:  severe uncontrolled pain    Complete by:  As directed    Diet - low sodium heart healthy    Complete by:  As directed     Discharge instructions    Complete by:  As directed    Continue to ensure comfort care measures for EOL   Increase activity slowly    Complete by:  As directed      Current Discharge Medication List    START taking these medications   Details  LORazepam (ATIVAN) 2 MG/ML injection Inject 0.25 mLs (0.5 mg total) into the vein every 6 (six) hours as needed for anxiety. Qty: 1 mL, Refills: 0    morphine 250 mg in sodium chloride 0.9 % 240 mL Inject 1 mg/hr into the vein continuous.      STOP taking these medications     valsartan-hydrochlorothiazide (DIOVAN-HCT) 160-12.5 MG tablet        No Known Allergies    The results of significant diagnostics from this hospitalization (including imaging, microbiology, ancillary and laboratory) are listed below for reference.    Significant Diagnostic Studies: Ct Angio Head W Or Wo Contrast  Result Date: 07/28/2016 CLINICAL DATA:  Aphasia after fall in bathroom. EXAM: CT ANGIOGRAPHY HEAD AND NECK CT PERFUSION BRAIN TECHNIQUE: Multidetector CT imaging of the head and neck was performed using the standard protocol during bolus administration of intravenous contrast. Multiplanar CT image reconstructions and MIPs were obtained to evaluate the vascular anatomy. Carotid stenosis measurements (when applicable) are obtained utilizing NASCET criteria, using the distal internal carotid diameter as the denominator. Multiphase CT imaging of the  brain was performed following IV bolus contrast injection. Subsequent parametric perfusion maps were calculated using RAPID software. CONTRAST:  100 cc Isovue 370 COMPARISON:  CT HEAD July 27, 2016 at 2308 hours FINDINGS: CTA NECK AORTIC ARCH: Normal appearance of the thoracic arch, normal branch pattern. Mild calcific atherosclerosis of arch vessel origins. The origins of the innominate, left Common carotid artery and subclavian artery are widely patent. RIGHT CAROTID SYSTEM: Common carotid artery is widely patent,  coursing in a straight line fashion. Normal appearance of the carotid bifurcation without hemodynamically significant stenosis by NASCET criteria. Tortuous internal carotid artery narrowing is patent. LEFT CAROTID SYSTEM: Common carotid artery is widely patent, coursing in a straight line fashion, minimal calcific atherosclerosis. Early carotid bifurcation without hemodynamically significant stenosis by NASCET criteria. Gradual decrease contrast opacification of LEFT cervical internal carotid artery most compatible with distal occlusion. VERTEBRAL ARTERIES:Left vertebral artery is dominant, mild stenosis of LEFT vertebral artery origin due to atherosclerosis. Bilateral vertebral arteries are patent, and mild extrinsic deformity due to degenerative cervical spine. SKELETON: No acute osseous process though bone windows have not been submitted. Multilevel severe degenerative discs. 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 OTHER NECK: Soft tissues of the neck are non-acute though, not tailored for evaluation. Dependent atelectasis. 2.7 cm heterogeneous RIGHT thyroid nodule. LEFT external jugular intravenous catheter. CTA HEAD ANTERIOR CIRCULATION: Complete loss of contrast opacification LEFT internal carotid artery through the level of the carotid terminus. RIGHT internal carotid artery is patent though, moderately stenotic at supraclinoid segment with mild poststenotic dilatation. Thready LEFT A1 segment with patent anterior communicating arteries. Developmentally absent RIGHT A1 segment. Patent anterior cerebral artery is. Occluded LEFT middle cerebral artery with collateral vessels filling the posterior segments. RIGHT middle cerebral artery is widely patent. No contrast extravasation or aneurysm. POSTERIOR CIRCULATION: Patent bilateral vertebral artery's, mild luminal regularity old LEFT V4 segment most compatible with atherosclerosis. Patent basilar artery with moderate stenosis proximal  basilar artery. Patent main branch vessels. Fetal origin RIGHT posterior cerebral artery. Patent bilateral posterior cerebral arteries with moderate tandem stenoses. No contrast extravasation or aneurysm. VENOUS SINUSES: Major dural venous sinuses are patent though not tailored for evaluation on this angiographic examination. ANATOMIC VARIANTS: Developmentally absent RIGHT A1 segment. Fetal origin RIGHT posterior cerebral artery. DELAYED PHASE: Not performed. MIP images reviewed. CT Brain Perfusion Findings: CBF (<30%) Volume: Perfusion (Tmax>6.0s) volume: Mismatch Volume: Infarction Location:LEFT frontotemporal lobes spanning the middle cerebral arteries. IMPRESSION: CTA NECK: Gradual loss of LEFT internal carotid artery contrast opacification most compatible with slow flow in distal occlusion. No hemodynamically significant stenosis. CTA HEAD: Acute LEFT MCA occlusion, collateral vessels filling posterior segments. Developmentally absent RIGHT A1 segment with thready LEFT A1 segment suggesting retrograde thrombosis to the carotid terminus. Moderate intracranial atherosclerosis. **An incidental finding of potential clinical significance has been found. 2.7 cm RIGHT thyroid nodule for which follow up thyroid sonogram is recommended on a nonemergent basis. This follows ACR consensus guidelines: Managing Incidental Thyroid Nodules Detected on Imaging: White Paper of the ACR Incidental Thyroid Findings Committee. J Am Coll Radiol 2015; 12:143-150.** CT PERFUSION: Acute large LEFT MCA territory completed infarct with penumbra within the posterior margin of the LEFT MCA territory and LEFT anterior cerebral artery territory. Critical Value/emergent results were called by telephone at the time of interpretation on 07/28/2016 at 12:28 am to Dr. Ritta Slot , who verbally acknowledged these results. Electronically Signed   By: Awilda Metro M.D.   On: 07/28/2016 01:51   Ct  Head Wo  Contrast  Result Date: 07/27/2016 CLINICAL DATA:  Unwitnessed fall in the bathroom. Laceration to posterior scout with bleeding. EXAM: CT HEAD WITHOUT CONTRAST CT CERVICAL SPINE WITHOUT CONTRAST TECHNIQUE: Multidetector CT imaging of the head and cervical spine was performed following the standard protocol without intravenous contrast. Multiplanar CT image reconstructions of the cervical spine were also generated. COMPARISON:  None. FINDINGS: CT HEAD FINDINGS Brain: Diffuse cerebral atrophy. Ventricular dilatation consistent with central atrophy. Low-attenuation changes in the deep white matter consistent with small vessel ischemia. No mass effect or midline shift. No abnormal extra-axial fluid collections. [place]-white matter junctions are distinct. Basal cisterns are not effaced. No acute intracranial hemorrhage. Vascular: Vascular calcifications are present in the carotid siphons and vertebrobasilar arteries. Skull: The calvarium appears intact. Sinuses/Orbits: Mucosal thickening in the paranasal sinuses. No acute air-fluid levels. Mastoid air cells are not opacified. Other: None. CT CERVICAL SPINE FINDINGS Alignment: Straightening of the usual cervical lordosis. This may be due to patient positioning or degenerative change but muscle spasm or ligamentous injury can also have this appearance and are not excluded. There is evidence of basilar invagination of the odontoid process with degenerative changes at C1 to level. Skull base and vertebrae: There is a benign-appearing cyst in the odontoid process of C2. Degenerative changes at C1 to cause loss of the space between the anterior arch of C1 and the odontoid process. No vertebral compression deformities. No acute fractures are demonstrated. Soft tissues and spinal canal: No prevertebral fluid or swelling. No visible canal hematoma. Disc levels: Degenerative changes diffusely throughout the cervical spine with narrowed interspaces and endplate hypertrophic  changes. Prominent disc osteophyte complexes cause some encroachment upon the central canal at C4-5, C5-6, and C6-7 levels. Degenerative changes throughout the facet joints. Upper chest: Evaluation is limited due to respiratory motion but there is evidence of atelectasis or consolidation in both lungs posteriorly. Other: Diffuse enlargement of the thyroid gland on the right. This is probably due to goiter. Vascular calcifications in the cervical carotid arteries. IMPRESSION: Head: No acute intracranial abnormalities. Chronic atrophy and small vessel ischemic changes. Cervical spine: Diffuse degenerative changes. Nonspecific straightening of usual cervical lordosis. Prominent degenerative changes at C1 to with mild basilar invagination. Bones cyst at the odontoid process of C2. No acute displaced fractures identified in the cervical spine. Atelectasis or consolidation demonstrated in the lung apices. Electronically Signed   By: Burman Nieves M.D.   On: 07/27/2016 23:26   Ct Angio Neck W Or Wo Contrast  Result Date: 07/28/2016 CLINICAL DATA:  Aphasia after fall in bathroom. EXAM: CT ANGIOGRAPHY HEAD AND NECK CT PERFUSION BRAIN TECHNIQUE: Multidetector CT imaging of the head and neck was performed using the standard protocol during bolus administration of intravenous contrast. Multiplanar CT image reconstructions and MIPs were obtained to evaluate the vascular anatomy. Carotid stenosis measurements (when applicable) are obtained utilizing NASCET criteria, using the distal internal carotid diameter as the denominator. Multiphase CT imaging of the brain was performed following IV bolus contrast injection. Subsequent parametric perfusion maps were calculated using RAPID software. CONTRAST:  100 cc Isovue 370 COMPARISON:  CT HEAD July 27, 2016 at 2308 hours FINDINGS: CTA NECK AORTIC ARCH: Normal appearance of the thoracic arch, normal branch pattern. Mild calcific atherosclerosis of arch vessel origins. The origins  of the innominate, left Common carotid artery and subclavian artery are widely patent. RIGHT CAROTID SYSTEM: Common carotid artery is widely patent, coursing in a straight line fashion. Normal appearance of the carotid bifurcation without  hemodynamically significant stenosis by NASCET criteria. Tortuous internal carotid artery narrowing is patent. LEFT CAROTID SYSTEM: Common carotid artery is widely patent, coursing in a straight line fashion, minimal calcific atherosclerosis. Early carotid bifurcation without hemodynamically significant stenosis by NASCET criteria. Gradual decrease contrast opacification of LEFT cervical internal carotid artery most compatible with distal occlusion. VERTEBRAL ARTERIES:Left vertebral artery is dominant, mild stenosis of LEFT vertebral artery origin due to atherosclerosis. Bilateral vertebral arteries are patent, and mild extrinsic deformity due to degenerative cervical spine. SKELETON: No acute osseous process though bone windows have not been submitted. Multilevel severe degenerative discs. 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 OTHER NECK: Soft tissues of the neck are non-acute though, not tailored for evaluation. Dependent atelectasis. 2.7 cm heterogeneous RIGHT thyroid nodule. LEFT external jugular intravenous catheter. CTA HEAD ANTERIOR CIRCULATION: Complete loss of contrast opacification LEFT internal carotid artery through the level of the carotid terminus. RIGHT internal carotid artery is patent though, moderately stenotic at supraclinoid segment with mild poststenotic dilatation. Thready LEFT A1 segment with patent anterior communicating arteries. Developmentally absent RIGHT A1 segment. Patent anterior cerebral artery is. Occluded LEFT middle cerebral artery with collateral vessels filling the posterior segments. RIGHT middle cerebral artery is widely patent. No contrast extravasation or aneurysm. POSTERIOR CIRCULATION: Patent bilateral vertebral  artery's, mild luminal regularity old LEFT V4 segment most compatible with atherosclerosis. Patent basilar artery with moderate stenosis proximal basilar artery. Patent main branch vessels. Fetal origin RIGHT posterior cerebral artery. Patent bilateral posterior cerebral arteries with moderate tandem stenoses. No contrast extravasation or aneurysm. VENOUS SINUSES: Major dural venous sinuses are patent though not tailored for evaluation on this angiographic examination. ANATOMIC VARIANTS: Developmentally absent RIGHT A1 segment. Fetal origin RIGHT posterior cerebral artery. DELAYED PHASE: Not performed. MIP images reviewed. CT Brain Perfusion Findings: CBF (<30%) Volume: Perfusion (Tmax>6.0s) volume: Mismatch Volume: Infarction Location:LEFT frontotemporal lobes spanning the middle cerebral arteries. IMPRESSION: CTA NECK: Gradual loss of LEFT internal carotid artery contrast opacification most compatible with slow flow in distal occlusion. No hemodynamically significant stenosis. CTA HEAD: Acute LEFT MCA occlusion, collateral vessels filling posterior segments. Developmentally absent RIGHT A1 segment with thready LEFT A1 segment suggesting retrograde thrombosis to the carotid terminus. Moderate intracranial atherosclerosis. **An incidental finding of potential clinical significance has been found. 2.7 cm RIGHT thyroid nodule for which follow up thyroid sonogram is recommended on a nonemergent basis. This follows ACR consensus guidelines: Managing Incidental Thyroid Nodules Detected on Imaging: White Paper of the ACR Incidental Thyroid Findings Committee. J Am Coll Radiol 2015; 12:143-150.** CT PERFUSION: Acute large LEFT MCA territory completed infarct with penumbra within the posterior margin of the LEFT MCA territory and LEFT anterior cerebral artery territory. Critical Value/emergent results were called by telephone at the time of interpretation on 07/28/2016 at 12:28 am to Dr. Ritta Slot  , who verbally acknowledged these results. Electronically Signed   By: Awilda Metro M.D.   On: 07/28/2016 01:51   Ct Cervical Spine Wo Contrast  Result Date: 07/27/2016 CLINICAL DATA:  Unwitnessed fall in the bathroom. Laceration to posterior scout with bleeding. EXAM: CT HEAD WITHOUT CONTRAST CT CERVICAL SPINE WITHOUT CONTRAST TECHNIQUE: Multidetector CT imaging of the head and cervical spine was performed following the standard protocol without intravenous contrast. Multiplanar CT image reconstructions of the cervical spine were also generated. COMPARISON:  None. FINDINGS: CT HEAD FINDINGS Brain: Diffuse cerebral atrophy. Ventricular dilatation consistent with central atrophy. Low-attenuation changes in the deep white matter consistent with small vessel ischemia.  No mass effect or midline shift. No abnormal extra-axial fluid collections. [place]-white matter junctions are distinct. Basal cisterns are not effaced. No acute intracranial hemorrhage. Vascular: Vascular calcifications are present in the carotid siphons and vertebrobasilar arteries. Skull: The calvarium appears intact. Sinuses/Orbits: Mucosal thickening in the paranasal sinuses. No acute air-fluid levels. Mastoid air cells are not opacified. Other: None. CT CERVICAL SPINE FINDINGS Alignment: Straightening of the usual cervical lordosis. This may be due to patient positioning or degenerative change but muscle spasm or ligamentous injury can also have this appearance and are not excluded. There is evidence of basilar invagination of the odontoid process with degenerative changes at C1 to level. Skull base and vertebrae: There is a benign-appearing cyst in the odontoid process of C2. Degenerative changes at C1 to cause loss of the space between the anterior arch of C1 and the odontoid process. No vertebral compression deformities. No acute fractures are demonstrated. Soft tissues and spinal canal: No prevertebral fluid or swelling. No visible canal  hematoma. Disc levels: Degenerative changes diffusely throughout the cervical spine with narrowed interspaces and endplate hypertrophic changes. Prominent disc osteophyte complexes cause some encroachment upon the central canal at C4-5, C5-6, and C6-7 levels. Degenerative changes throughout the facet joints. Upper chest: Evaluation is limited due to respiratory motion but there is evidence of atelectasis or consolidation in both lungs posteriorly. Other: Diffuse enlargement of the thyroid gland on the right. This is probably due to goiter. Vascular calcifications in the cervical carotid arteries. IMPRESSION: Head: No acute intracranial abnormalities. Chronic atrophy and small vessel ischemic changes. Cervical spine: Diffuse degenerative changes. Nonspecific straightening of usual cervical lordosis. Prominent degenerative changes at C1 to with mild basilar invagination. Bones cyst at the odontoid process of C2. No acute displaced fractures identified in the cervical spine. Atelectasis or consolidation demonstrated in the lung apices. Electronically Signed   By: Burman Nieves M.D.   On: 07/27/2016 23:26   Dg Pelvis Portable  Result Date: 07/27/2016 CLINICAL DATA:  Trauma.  Unwitnessed fall tonight. EXAM: PORTABLE PELVIS 1-2 VIEWS COMPARISON:  None. FINDINGS: The patient is rotated. Diffuse bone demineralization. There is no evidence of pelvic fracture or diastasis. No pelvic bone lesions are seen. Degenerative changes in the hips. IMPRESSION: No acute bony abnormalities.  Degenerative changes in the hips. Electronically Signed   By: Burman Nieves M.D.   On: 07/27/2016 23:15   Ct Cerebral Perfusion W Contrast  Result Date: 07/28/2016 CLINICAL DATA:  Aphasia after fall in bathroom. EXAM: CT ANGIOGRAPHY HEAD AND NECK CT PERFUSION BRAIN TECHNIQUE: Multidetector CT imaging of the head and neck was performed using the standard protocol during bolus administration of intravenous contrast. Multiplanar CT image  reconstructions and MIPs were obtained to evaluate the vascular anatomy. Carotid stenosis measurements (when applicable) are obtained utilizing NASCET criteria, using the distal internal carotid diameter as the denominator. Multiphase CT imaging of the brain was performed following IV bolus contrast injection. Subsequent parametric perfusion maps were calculated using RAPID software. CONTRAST:  100 cc Isovue 370 COMPARISON:  CT HEAD July 27, 2016 at 2308 hours FINDINGS: CTA NECK AORTIC ARCH: Normal appearance of the thoracic arch, normal branch pattern. Mild calcific atherosclerosis of arch vessel origins. The origins of the innominate, left Common carotid artery and subclavian artery are widely patent. RIGHT CAROTID SYSTEM: Common carotid artery is widely patent, coursing in a straight line fashion. Normal appearance of the carotid bifurcation without hemodynamically significant stenosis by NASCET criteria. Tortuous internal carotid artery narrowing is patent. LEFT CAROTID  SYSTEM: Common carotid artery is widely patent, coursing in a straight line fashion, minimal calcific atherosclerosis. Early carotid bifurcation without hemodynamically significant stenosis by NASCET criteria. Gradual decrease contrast opacification of LEFT cervical internal carotid artery most compatible with distal occlusion. VERTEBRAL ARTERIES:Left vertebral artery is dominant, mild stenosis of LEFT vertebral artery origin due to atherosclerosis. Bilateral vertebral arteries are patent, and mild extrinsic deformity due to degenerative cervical spine. SKELETON: No acute osseous process though bone windows have not been submitted. Multilevel severe degenerative discs. 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 OTHER NECK: Soft tissues of the neck are non-acute though, not tailored for evaluation. Dependent atelectasis. 2.7 cm heterogeneous RIGHT thyroid nodule. LEFT external jugular intravenous catheter. CTA HEAD ANTERIOR  CIRCULATION: Complete loss of contrast opacification LEFT internal carotid artery through the level of the carotid terminus. RIGHT internal carotid artery is patent though, moderately stenotic at supraclinoid segment with mild poststenotic dilatation. Thready LEFT A1 segment with patent anterior communicating arteries. Developmentally absent RIGHT A1 segment. Patent anterior cerebral artery is. Occluded LEFT middle cerebral artery with collateral vessels filling the posterior segments. RIGHT middle cerebral artery is widely patent. No contrast extravasation or aneurysm. POSTERIOR CIRCULATION: Patent bilateral vertebral artery's, mild luminal regularity old LEFT V4 segment most compatible with atherosclerosis. Patent basilar artery with moderate stenosis proximal basilar artery. Patent main branch vessels. Fetal origin RIGHT posterior cerebral artery. Patent bilateral posterior cerebral arteries with moderate tandem stenoses. No contrast extravasation or aneurysm. VENOUS SINUSES: Major dural venous sinuses are patent though not tailored for evaluation on this angiographic examination. ANATOMIC VARIANTS: Developmentally absent RIGHT A1 segment. Fetal origin RIGHT posterior cerebral artery. DELAYED PHASE: Not performed. MIP images reviewed. CT Brain Perfusion Findings: CBF (<30%) Volume: Perfusion (Tmax>6.0s) volume: Mismatch Volume: Infarction Location:LEFT frontotemporal lobes spanning the middle cerebral arteries. IMPRESSION: CTA NECK: Gradual loss of LEFT internal carotid artery contrast opacification most compatible with slow flow in distal occlusion. No hemodynamically significant stenosis. CTA HEAD: Acute LEFT MCA occlusion, collateral vessels filling posterior segments. Developmentally absent RIGHT A1 segment with thready LEFT A1 segment suggesting retrograde thrombosis to the carotid terminus. Moderate intracranial atherosclerosis. **An incidental finding of potential clinical significance  has been found. 2.7 cm RIGHT thyroid nodule for which follow up thyroid sonogram is recommended on a nonemergent basis. This follows ACR consensus guidelines: Managing Incidental Thyroid Nodules Detected on Imaging: White Paper of the ACR Incidental Thyroid Findings Committee. J Am Coll Radiol 2015; 12:143-150.** CT PERFUSION: Acute large LEFT MCA territory completed infarct with penumbra within the posterior margin of the LEFT MCA territory and LEFT anterior cerebral artery territory. Critical Value/emergent results were called by telephone at the time of interpretation on 07/28/2016 at 12:28 am to Dr. Ritta Slot , who verbally acknowledged these results. Electronically Signed   By: Awilda Metro M.D.   On: 07/28/2016 01:51   Dg Chest Port 1 View  Result Date: 07/27/2016 CLINICAL DATA:  Unwitnessed fall. Tachycardia and shortness of breath. EXAM: PORTABLE CHEST 1 VIEW COMPARISON:  None. FINDINGS: Shallow inspiration. Heart size and pulmonary vascularity are normal for technique. Tortuous and dilated aorta. Mediastinum is prominent. This could just be due to shallow inspiration and portable technique but mediastinal injury is not excluded. No focal airspace disease or consolidation in the lungs. No blunting of costophrenic angles. No pneumothorax. Visualize ribs appear intact. Postoperative changes in both shoulders. IMPRESSION: Shallow inspiration. No evidence of active pulmonary disease. Prominence of the mediastinum is likely due to technique and  dilated aorta but mediastinal injury not excluded. Electronically Signed   By: Burman Nieves M.D.   On: 07/27/2016 23:17    Microbiology: No results found for this or any previous visit (from the past 240 hour(s)).   Labs: Basic Metabolic Panel:  Recent Labs Lab 07/27/16 2251 07/27/16 2255  NA 136 138  K 4.3 4.1  CL 99* 103  CO2 25  --   GLUCOSE 180* 182*  BUN 29* 32*  CREATININE 1.21* 1.20*  CALCIUM 10.4*  --    Liver Function  Tests:  Recent Labs Lab 07/27/16 2251  AST 25  ALT 14  ALKPHOS 65  BILITOT 0.8  PROT 6.9  ALBUMIN 4.4   No results for input(s): LIPASE, AMYLASE in the last 168 hours. No results for input(s): AMMONIA in the last 168 hours. CBC:  Recent Labs Lab 07/27/16 2251 07/27/16 2255  WBC 11.9*  --   HGB 13.0 13.3  HCT 39.8 39.0  MCV 97.8  --   PLT 169  --    Cardiac Enzymes:  Recent Labs Lab 07/27/16 2251  CKTOTAL 106  CKMB 3.7   BNP: BNP (last 3 results) No results for input(s): BNP in the last 8760 hours.  ProBNP (last 3 results) No results for input(s): PROBNP in the last 8760 hours.  CBG:  Recent Labs Lab 07/28/16 0008  GLUCAP 177*     Signed:  Penny Pia MD.  [facility] 07/29/2016, 2:35 PM

## 2016-07-29 NOTE — Progress Notes (Addendum)
Palliative Medicine RN Note: AM PMT follow up. Pt has had prn morphine overnight. Granddaughter at bedside feels pain is not well-controlled. Family does report that she has stopped picking at blankets. She is less interactive with me and with family today. Family and staff gave her a thorough bath around 4 am per family and washed the blood out of her hair as much as possible. There is concern because pt has not voided since 1600 yesterday. Spoke with Dr Phillips OdorGolding; she increased morphine to q3hours scheduled and gave order to scan bladder and place foley prn. Plan is for family to meet with [facility] around noon today. PMT will follow up after that.  Sandra ChanceMelanie G. Sandra Chiriboga, RN, BSN, [facility] 07/29/2016 10:08 AM Cell [phone] 8:00-4:00 Monday-Friday Office [phone]

## 2016-07-29 NOTE — Progress Notes (Signed)
Family members at bedside throughout night (eldest son, and two granddaughters-Tracy and Toni AmendCourtney).  Family asking appropriate questions about patient prognosis and care; questions answered, emotional support given as appropriate.  Handouts given on end-of-life care as well as death and dying to better prepare all family members in their expectations in the coming time.  Patient required robinul dose as well as PRN dose morphine.  Extremities beginning to become cool to touch. Patient resting well at this time after bed bath.  Continue to monitor.

## 2016-07-30 ENCOUNTER — Encounter: Payer: Self-pay | Admitting: Emergency Medicine

## 2016-08-15 DEATH — deceased

## 2017-09-27 IMAGING — CT CT CERVICAL SPINE W/O CM
5 of 8 series · 11 of 33 positions shown, 12 images · non-contrast
Comparison: None.

CLINICAL DATA: Unwitnessed fall in the bathroom. Laceration to
posterior scout with bleeding.

EXAM:
CT HEAD WITHOUT CONTRAST
CT CERVICAL SPINE WITHOUT CONTRAST
TECHNIQUE: Multidetector CT imaging of the head and cervical spine was
performed following the standard protocol without intravenous
contrast. Multiplanar CT image reconstructions of the cervical spine
were also generated.

[Series 5: head bone · axial · 0.38mm/px · z∈[+1192,+1244]mm · 2 of 78 slices shown]
[im 26/78  bone]
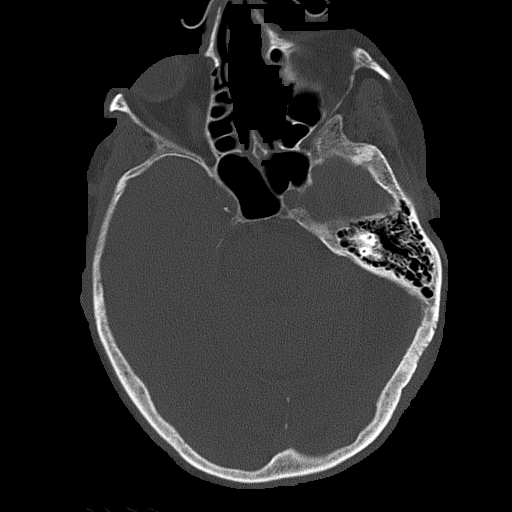
[im 52/78  bone]
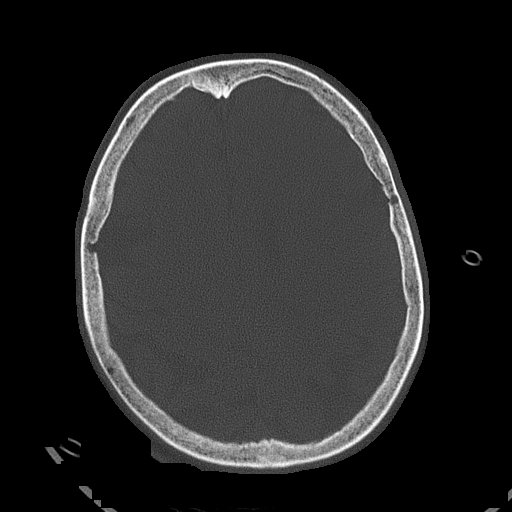

[Series 8: c_spine 2.0 st · axial · 0.30mm/px · z∈[+1046,+1096]mm · 2 of 75 slices shown, 3 images]
[im 25/75  soft-tissue]
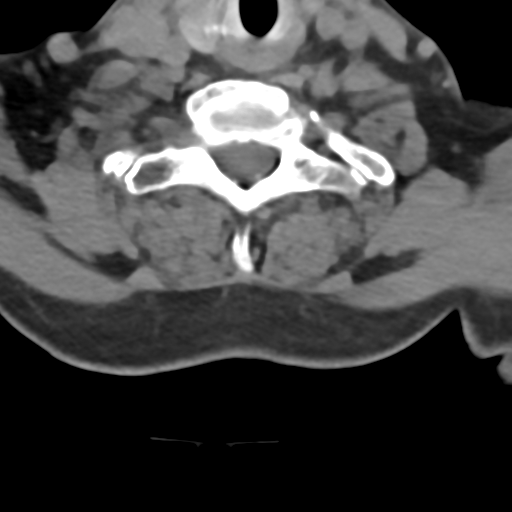
[im 25/75  bone]
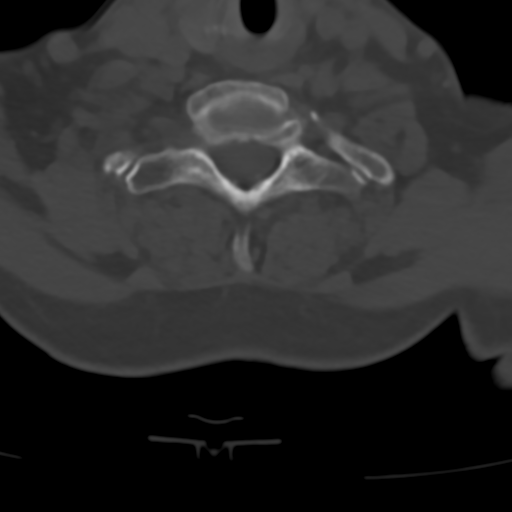
[im 50/75  bone]
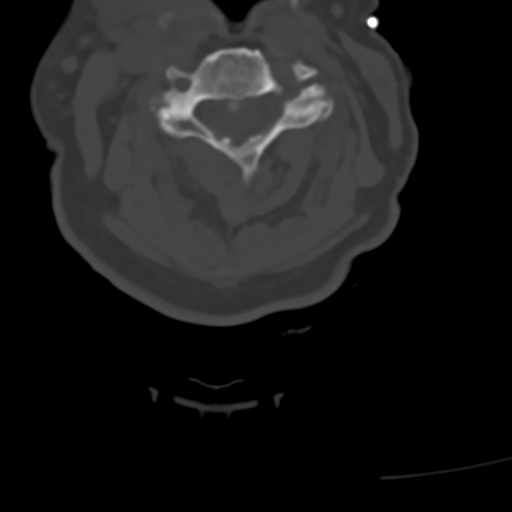

[Series 10: c_spine 2.0 sag bone · sagittal · 0.23mm/px · 4 of 61 slices shown]
[im 13/61  bone]
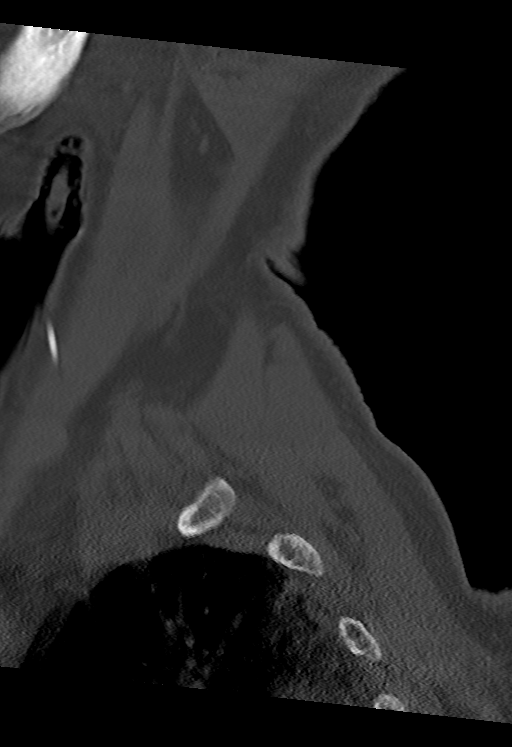
[im 25/61  bone]
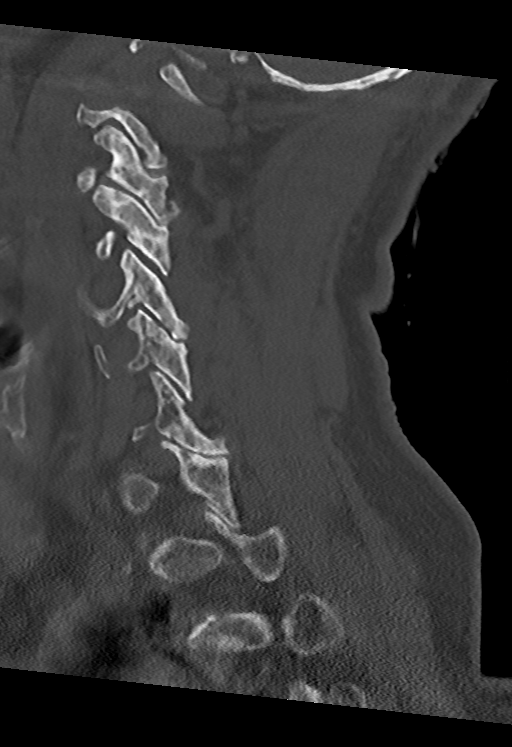
[im 37/61  bone]
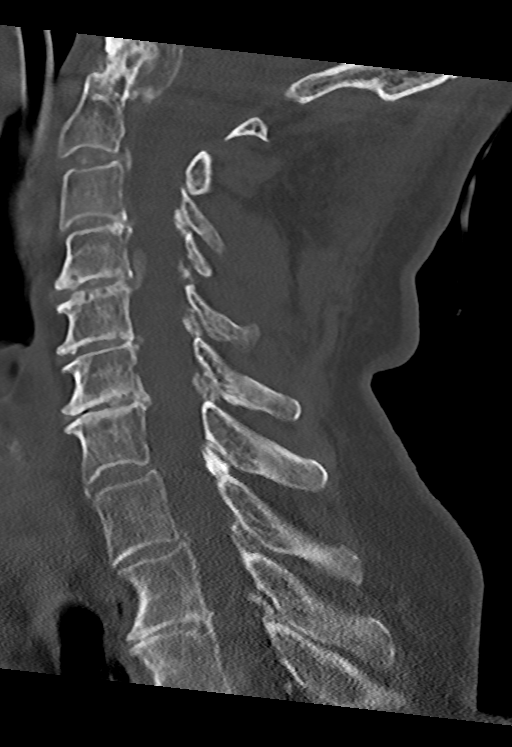
[im 49/61  bone]
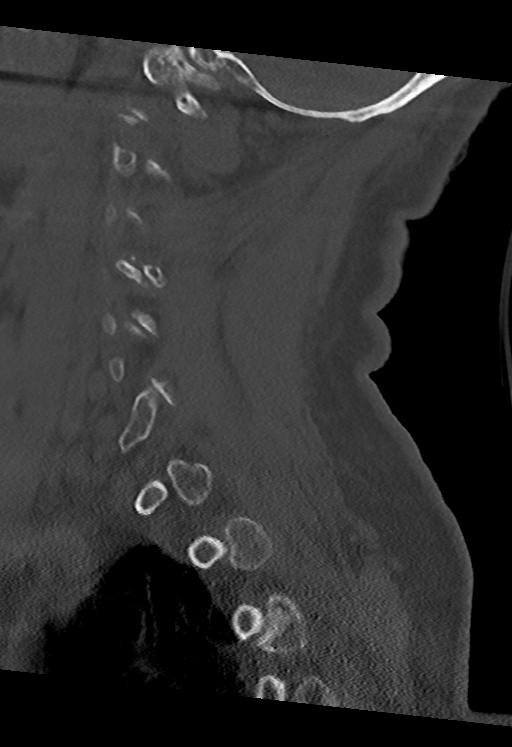

[Series 11: c_spine 2.0 cor bone · coronal · 0.23mm/px · 1 of 61 slices shown]
[im 31/61  bone]
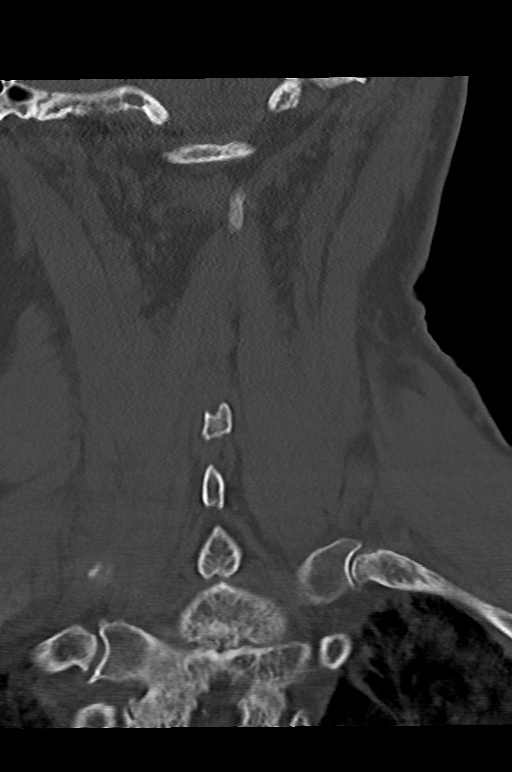

[Series 12: c_spine 2.0 orthogonals · axial · 0.21mm/px · z∈[+1032,+1084]mm · 2 of 81 slices shown]
[im 27/81  bone]
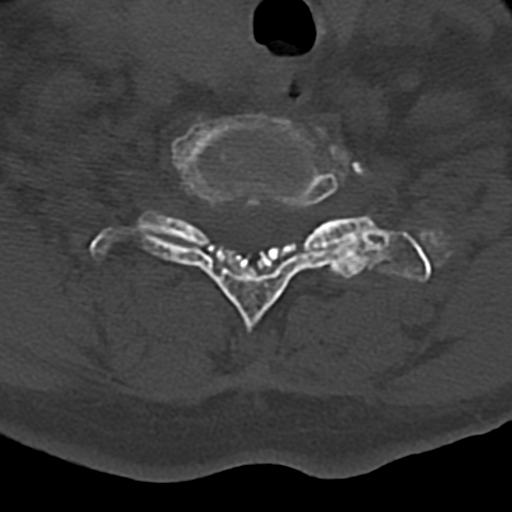
[im 54/81  bone]
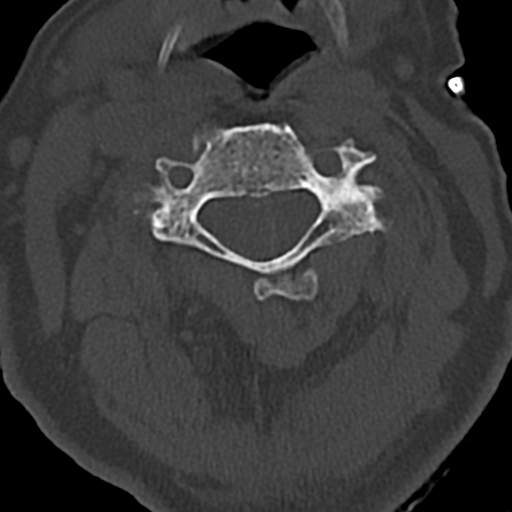

[11 of 33 positions shown; findings below may reference images not displayed]

FINDINGS: CT HEAD FINDINGS

Brain: Diffuse cerebral atrophy. Ventricular dilatation consistent
with central atrophy. Low-attenuation changes in the deep white
matter consistent with small vessel ischemia. No mass effect or
midline shift. No abnormal extra-axial fluid collections. Gray-white
matter junctions are distinct. Basal cisterns are not effaced. No
acute intracranial hemorrhage.

Vascular: Vascular calcifications are present in the carotid siphons
and vertebrobasilar arteries.

Skull: The calvarium appears intact.

Sinuses/Orbits: Mucosal thickening in the paranasal sinuses. No
acute air-fluid levels. Mastoid air cells are not opacified.

Other: None.

CT CERVICAL SPINE FINDINGS

Alignment: Straightening of the usual cervical lordosis. This may be
due to patient positioning or degenerative change but muscle spasm
or ligamentous injury can also have this appearance and are not
excluded. There is evidence of basilar invagination of the odontoid
process with degenerative changes at C1 to level.

Skull base and vertebrae: There is a benign-appearing cyst in the
odontoid process of C2. Degenerative changes at C1 to cause loss of
the space between the anterior arch of C1 and the odontoid process.
No vertebral compression deformities. No acute fractures are
demonstrated.

Soft tissues and spinal canal: No prevertebral fluid or swelling. No
visible canal hematoma.

Disc levels: Degenerative changes diffusely throughout the cervical
spine with narrowed interspaces and endplate hypertrophic changes.
Prominent disc osteophyte complexes cause some encroachment upon the
central canal at C4-5, C5-6, and C6-7 levels. Degenerative changes
throughout the facet joints.

Upper chest: Evaluation is limited due to respiratory motion but
there is evidence of atelectasis or consolidation in both lungs
posteriorly.

Other: Diffuse enlargement of the thyroid gland on the right. This
is probably due to goiter. Vascular calcifications in the cervical
carotid arteries.
IMPRESSION: Head: No acute intracranial abnormalities. Chronic atrophy and small
vessel ischemic changes.

Cervical spine: Diffuse degenerative changes. Nonspecific
straightening of usual cervical lordosis. Prominent degenerative
changes at C1 to with mild basilar invagination. Bones cyst at the
odontoid process of C2. No acute displaced fractures identified in
the cervical spine.

Atelectasis or consolidation demonstrated in the lung apices.
